# Patient Record
Sex: Male | Born: 2000 | Race: White | Hispanic: No | Smoking: Never smoker
Health system: Southern US, Community
[De-identification: ages and names within clinical notes are randomized; demographics above are authoritative.]

## PROBLEM LIST (undated history)

## (undated) DIAGNOSIS — E119 Type 2 diabetes mellitus without complications: Secondary | ICD-10-CM

---
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
## 2022-09-29 ENCOUNTER — Other Ambulatory Visit: Payer: Self-pay

## 2022-09-29 ENCOUNTER — Encounter: Payer: Self-pay | Admitting: Emergency Medicine

## 2022-09-29 ENCOUNTER — Emergency Department
Admission: EM | Admit: 2022-09-29 | Discharge: 2022-09-29 | Disposition: A | Discharge status: Home / Self Care | Payer: Medicaid Other | Attending: Emergency Medicine | Admitting: Emergency Medicine

## 2022-09-29 DIAGNOSIS — J069 Acute upper respiratory infection, unspecified: Secondary | ICD-10-CM | POA: Diagnosis not present

## 2022-09-29 DIAGNOSIS — H60502 Unspecified acute noninfective otitis externa, left ear: Secondary | ICD-10-CM

## 2022-09-29 DIAGNOSIS — H6092 Unspecified otitis externa, left ear: Secondary | ICD-10-CM | POA: Diagnosis not present

## 2022-09-29 DIAGNOSIS — R059 Cough, unspecified: Secondary | ICD-10-CM | POA: Diagnosis present

## 2022-09-29 DIAGNOSIS — Z1152 Encounter for screening for COVID-19: Secondary | ICD-10-CM | POA: Diagnosis not present

## 2022-09-29 LAB — RESP PANEL BY RT-PCR (RSV, FLU A&B, COVID)  RVPGX2
Influenza A by PCR: NEGATIVE
Influenza B by PCR: NEGATIVE
Resp Syncytial Virus by PCR: NEGATIVE
SARS Coronavirus 2 by RT PCR: NEGATIVE

## 2022-09-29 MED ORDER — OFLOXACIN 0.3 % OT SOLN: 5.0000 [drp] | Freq: Every day | OTIC | 0 refills | Status: AC

## 2022-09-29 NOTE — ED Triage Notes (Signed)
Pt via POV from home. Pt c/o cough, headache, and nasal congestion. Also, c/o R sided ear pain and "blockage" for the past 2 weeks. Denies any sick contacts. Pt is A&Ox4 and NAD

## 2022-09-29 NOTE — ED Provider Notes (Signed)
[facility] Provider Note    Event Date/Time   First MD Initiated Contact with Patient 09/29/22 1745     (approximate)   History   Cough and Nasal Congestion   HPI Jacob Burton is a 22 y.o. male presenting today for nasal congestion and cough.  Patient reports he has had the symptoms for 3 to 4 days.  He is also had occasional headache associated with this.  Symptoms do get better with Tylenol.  Incidentally noted a carbon oxide leak at his work that was 2 days ago.  He did not have change in the symptoms while this was going on or afterwards.  That has since been fixed and he has not been exposed to it again.  Also notes some ear pain and blockage has been present for the past 2 weeks.  No sore throat symptoms, shortness of breath, chest pain, nausea, vomiting.     Physical Exam   Triage Vital Signs: ED Triage Vitals  Encounter Vitals Group     BP 09/29/22 1607 134/85     Systolic BP Percentile --      Diastolic BP Percentile --      Pulse Rate 09/29/22 1607 95     Resp 09/29/22 1607 18     Temp 09/29/22 1607 98.4 F (36.9 C)     Temp Source 09/29/22 1607 Oral     SpO2 09/29/22 1607 98 %     Weight 09/29/22 1605 135 lb (61.2 kg)     Height 09/29/22 1605 5\' 8"  (1.727 m)     Head Circumference --      Peak Flow --      Pain Score 09/29/22 1605 0     Pain Loc --      Pain Education --      Exclude from Growth Chart --     Most recent vital signs: Vitals:   09/29/22 1607  BP: 134/85  Pulse: 95  Resp: 18  Temp: 98.4 F (36.9 C)  SpO2: 98%   Physical Exam: I have reviewed the vital signs and nursing notes. General: Awake, alert, no acute distress.  Nontoxic appearing. Head:  Atraumatic, normocephalic.   ENT:  EOM intact, PERRL. Oral mucosa is pink and moist with no lesions.  Mild posterior oropharynx erythema but no tonsillar enlargement or exudates.  No tender cervical lymphadenopathy.  Bilateral ears with significant earwax  present. Neck: Neck is supple with full range of motion, No meningeal signs. Cardiovascular:  RRR, No murmurs. Peripheral pulses palpable and equal bilaterally. Respiratory:  Symmetrical chest wall expansion.  No rhonchi, rales, or wheezes.  Good air movement throughout.  No use of accessory muscles.   Musculoskeletal:  No cyanosis or edema. Moving extremities with full ROM Abdomen:  Soft, nontender, nondistended. Neuro:  GCS 15, moving all four extremities, interacting appropriately. Speech clear. Psych:  Calm, appropriate.   Skin:  Warm, dry, no rash.     ED Results / Procedures / Treatments   Labs (all labs ordered are listed, but only abnormal results are displayed) Labs Reviewed  RESP PANEL BY RT-PCR (RSV, FLU A&B, COVID)  [id number]     EKG    RADIOLOGY    PROCEDURES:  Critical Care performed: No  Procedures   MEDICATIONS ORDERED IN ED: Medications - No data to display   IMPRESSION / MDM / ASSESSMENT AND PLAN / ED COURSE  I reviewed the triage vital signs and the nursing notes.  Differential diagnosis includes, but is not limited to, viral URI, otitis media, viral pharyngitis, less likely carbon monoxide toxicity.  Patient's presentation is most consistent with acute illness / injury with system symptoms.  Patient is a 22 year old male presenting today with viral upper respiratory symptoms.  Vital signs are stable and physical exam largely reassuring.  Patient was negative for COVID, flu, and RSV but still suspect another viral respiratory infection as a result of his symptoms today.  He is 2 days out from his brief carbon oxide exposure and I suspect this is not causing any symptoms of today's presentation.  Both ears were cleared out by nurse staff here today.  Excoriations and some discharge present on the left ear.  Will send patient out on otic drops over concern for possible otitis externa.  Patient given the number for ENT to help  with his chronic ear problems.  He was given strict return precautions.      FINAL CLINICAL IMPRESSION(S) / ED DIAGNOSES   Final diagnoses:  Viral URI with cough  Acute otitis externa of left ear, unspecified type     Rx / DC Orders   ED Discharge Orders          Ordered    ofloxacin (FLOXIN) 0.3 % OTIC solution  Daily        09/29/22 1913             Note:  This document was prepared using Dragon voice recognition software and may include unintentional dictation errors.   Janith Lima, MD 09/29/22 [phone]

## 2022-09-29 NOTE — Discharge Instructions (Signed)
You were seen in the emergency department today for your upper respiratory symptoms as well as ear pain.  I do think it is most likely that this is a viral upper respiratory infection.  I will prescribe you eardrops for your left ear to take to prevent any infection given the excoriations and drainage from that site.  Continue trying to wash the ear out at home to get more earwax out.  ENT information should be on your discharge paperwork to call to set up outpatient appointment for this chronic problem.

## 2022-09-29 NOTE — ED Notes (Signed)
This RN and Sydni, RN to bedside to irrigate pt's ears. Ears irrigated with sterile saline and hydrogen peroxide. Large amount of cerumen irrigated from pt's right ear, moderate amount of cerumen irrigated from left ear. Small amt. Remains in right ear, eardrum partially visualized with otoscope. Cerumen plug remains in left ear, this RN unable to visualize eardrum. Dr. Anner Crete notified.

## 2023-06-11 IMAGING — CR DG CHEST 2V
2 series · 2 of 2 positions shown · non-contrast
Comparison: Chest x-ray 03/07/2006.

CLINICAL DATA: 20-year-old male with history of cough and shortness
of breath.

EXAM:
CHEST - 2 VIEW

[chest pa]
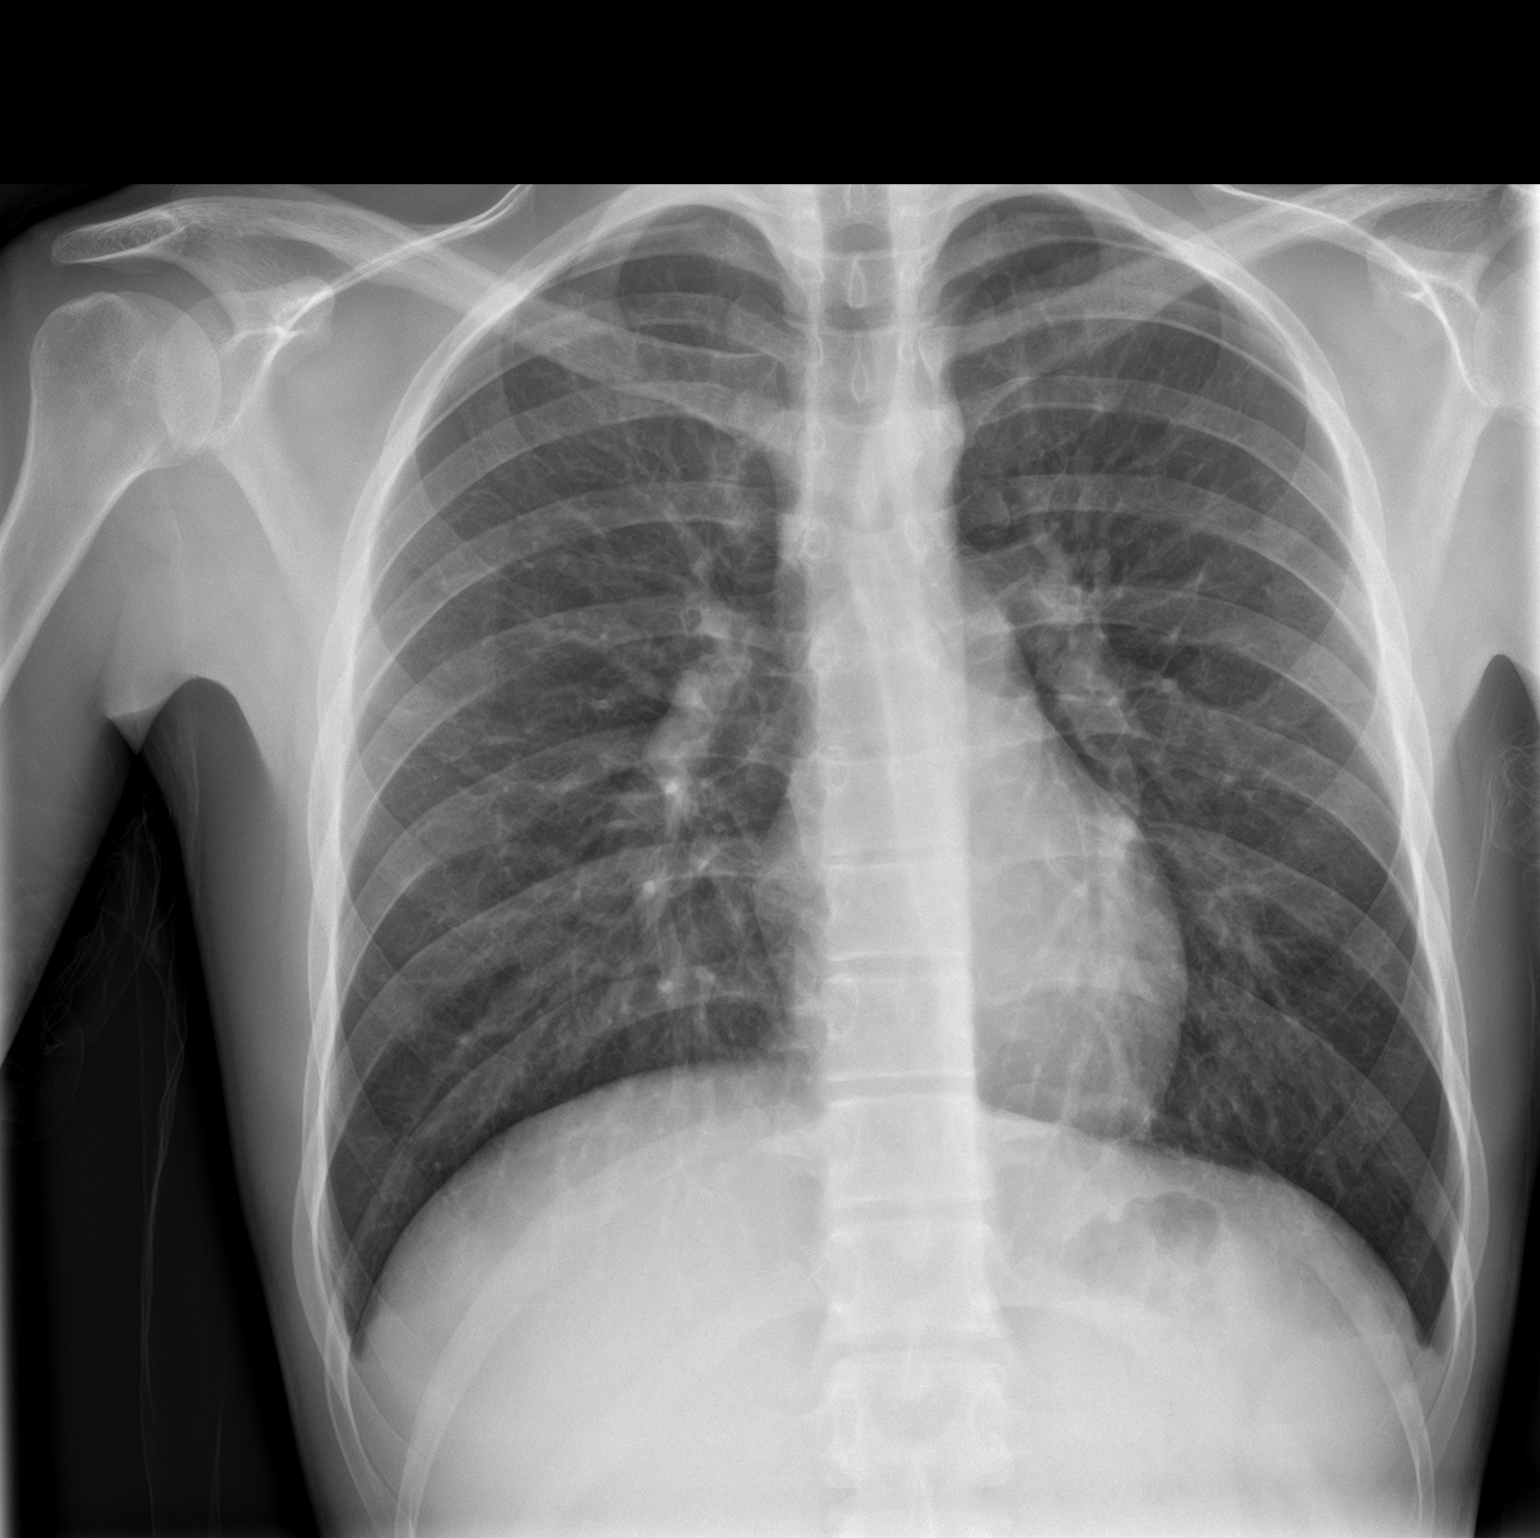

[chest lat]
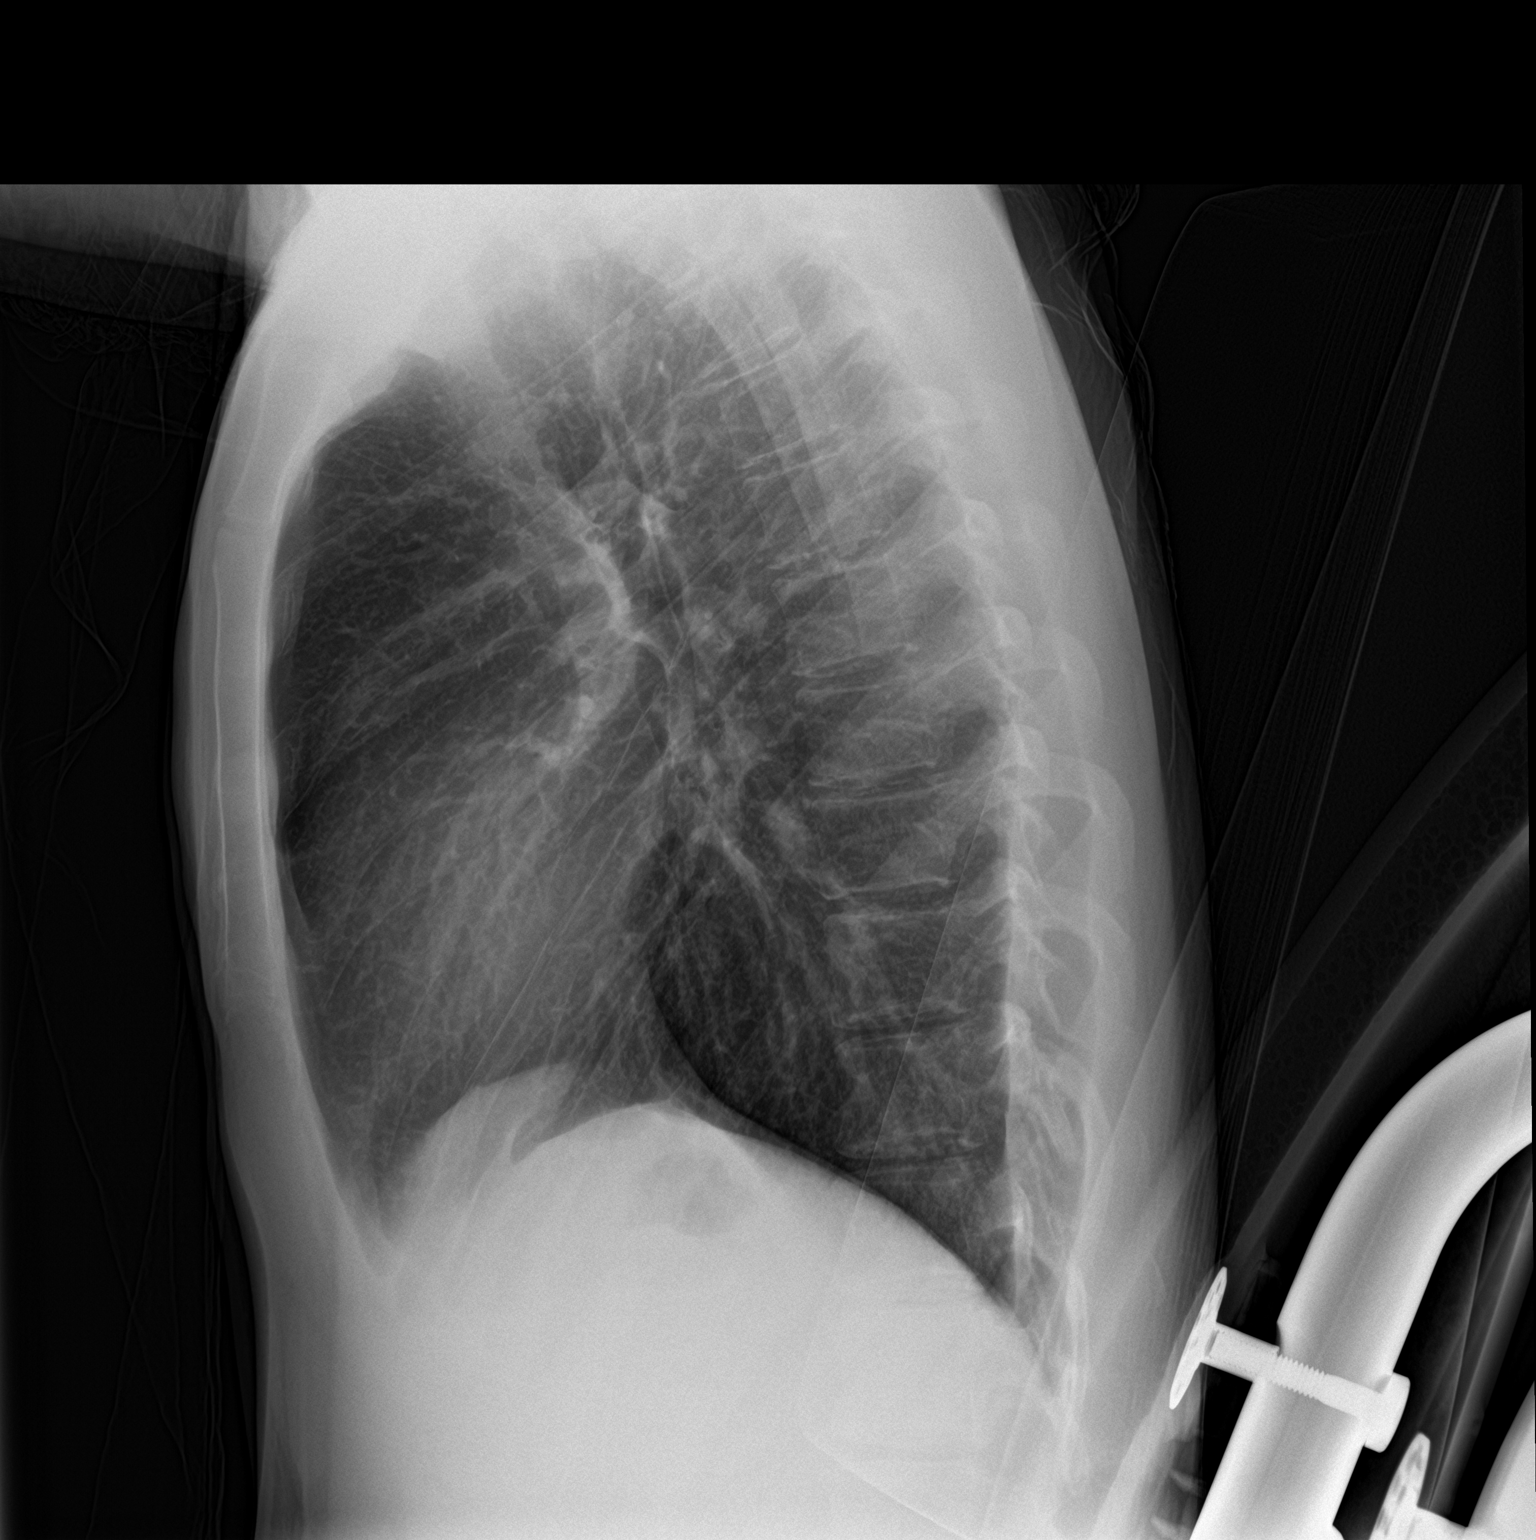

[2 of 2 positions shown; findings below may reference images not displayed]

FINDINGS: Lung volumes are normal. No consolidative airspace disease. No
pleural effusions. No pneumothorax. No pulmonary nodule or mass
noted. Pulmonary vasculature and the cardiomediastinal silhouette
are within normal limits.
IMPRESSION: No radiographic evidence of acute cardiopulmonary disease.

## 2023-10-15 NOTE — Discharge Summary (Signed)
 [facility] Discharge Summary  Admit Date: 10/15/2023 Discharge Date: 10/16/2023  Admitting Physician: Mercer Cline, MD Discharge Physician: Cline Mercer, MD  Primary Care Provider: Lennie Grumbles, Phone [phone]  Discharge Destination: Home  Admission Diagnoses:  DKA, type 1, not at goal (CMS/HHS-HCC) [E10.10]  Discharge Diagnoses:  Principal Problem (Resolved):   Type 1 diabetes mellitus with ketoacidosis without coma (CMS/HHS-HCC) Active Problems:   Type 1 diabetes mellitus without complication (CMS/HHS-HCC)  Primary Diagnosis: Admitted for DKA iso running out of insulin  pump meds iso losing insurance coverage  Changes Made (with rationale):  STARTED NPH 14u BID  STARTED Novolog  TID AC carb correction 1:8   CM sponsored one month of insulin  at discharge  To-Do List (incidental findings, follow-up studies, etc.): See below   Anticipatory Guidance for Outpatient Care:  Pt working on reapplying for [employer], also encouraged to obtain health insurance through his new employer. Opted for NPH for more affordable out-of-pocket cost.  Given lack of insurance unable to be seen at [facility] endocrine clinic. Not a [facility] resident so cannot be seen at [place]. Ambulatory referral to PCP placed at discharge, will attempt to set up with [facility] follow-up after Labor Day weekend and CM also provided list of free/discounted clinics in [place]. Sent an additional future Rx x1 month to local pharmacy starting 10/1 to pick up after finishing sponsored meds. Endocrine team provided clinic contact info in AVS in case of emergencies.     Results Pending at Discharge:  None Please see phone numbers at end of this summary for lab contact information.   Follow-up/Care Transition Plan: Sched. appts: No future appointments.  Follow-up info: No follow-up provider specified.     Allergies/Intolerances:  Allergies  Allergen Reactions  . Azithromycin  Hives and Other (See Comments)    hives Neuro problems  . Penicillins Dizziness and Hives    faints Has patient had a PCN reaction causing immediate rash, facial/tongue/throat swelling, SOB or lightheadedness with hypotension: YES Has patient had a PCN reaction causing severe rash involving mucus membranes or skin necrosis: NO Has patient had a PCN reaction that required hospitalization: WAS ALREADY IN HOSPITAL WHEN FOUND OUT. Has patient had a PCN reaction occurring within the last 10 years: NO If all of the above answers are NO, then may proceed with Cephalosporin use.  . Coconut Unknown  . Peach Swelling     New Adverse Drug Events: none  Medications:     Current Discharge Medication List     PAUSE taking these medications      Instructions  OMNIPOD DASH PODS (GEN 4) Crtg Wait to take this until your doctor or other care provider tells you to start again. Quantity: 15 each Refills: 11 Generic drug: insulin  pump cart,cont inf,BT  Inject 1 each subcutaneously as directed Change pod every 48 hours.       START taking these medications      Instructions  insulin  ASPART pen injector (concentration 100 units/mL) Quantity: 18 mL Refills: 0  Commonly known as: NovoLOG  FLEXPEN Inject 0-20 Units subcutaneously 3 (three) times daily with meals for 30 days Use a carb count ratio of 1 u of insulin  for every 8 grams of carbs. Doctor's comments: May switch between lispro and aspart and lispro-aabc (or between HumaLOG and Admelog and Lyumjev) per insurance preference.   insulin  ASPART pen injector (concentration 100 units/mL) Quantity: 18 mL Refills: 0 Start taking on: November 16, 2023  Commonly known as: NovoLOG  FLEXPEN Inject 0-20 Units subcutaneously  3 (three) times daily with meals for 30 days Use a carb count ratio of 1u of insulin  for every 8 grams of carbs.   insulin  NPH injection (concentration 100 units/mL) Refills: 0  Commonly known as: HumuLIN N Inject 14 Units  subcutaneously 2 (two) times daily before meals for 30 days Last time this was given: Ask your nurse or doctor   insulin  NPH pen injector (concentration 100 units/mL) Quantity: 8.4 mL Refills: 0 Start taking on: November 16, 2023  Commonly known as: HumuLIN N Pen Inject 14 Units subcutaneously 2 (two) times daily before meals for 30 days Last time this was given: Ask your nurse or doctor       CONTINUE taking these medications      Instructions  acetaminophen  325 MG tablet Refills: 0  Commonly known as: TYLENOL  Take 650 mg by mouth every 4 (four) hours as needed for Pain   DEXCOM G6 RECEIVER Misc Quantity: 1 each Refills: 1  Use 1 each once for 1 dose   DEXCOM G6 SENSOR Devi Quantity: 3 each Refills: 11  Use 1 each every 10 (ten) days   DEXCOM G7 SENSOR Devi Quantity: 3 each Refills: 11  Use 1 each every 10 (ten) days   FREESTYLE LIBRE 2 READER Misc Quantity: 1 each Refills: 1  Use 1 Device as directed   FREESTYLE LIBRE 2 SENSOR Kit Quantity: 2 kit Refills: 12  Change every 14 days   pen needle, diabetic 31 gauge x 3/16 needle Quantity: 100 each Refills: 0  Use as directed Doctor's comments: Needle length per patient preference or history.       STOP taking these medications    HumaLOG U-100 Insulin  injection (concentration 100 units/mL) Generic drug: insulin  LISPRO   insulin  GLARGINE pen injector (concentration 100 units/mL) Commonly known as: LANTUS  SOLOSTAR         Brief History of Present Illness: 23 y.o. male with T1DM presents with DKA   Patient has had T1DM and insulin  pump since age 66. Got kicked of medicaid for unclear reasons this month. He has been trying to reapply and appeal but it has not gone through yet.   Ran out of insulin  pump 2 days ago.    He was giving himself SQ humalog that he happened to have but blood sugars have been creeping up. Over the last 24 hours persistently over 400. He checks finger stick BG 5-7 times per day  (does not like using CGM).    Yesterday, he started having nausea, vomiting and headaches. He has had minimal PO intake and has had difficulty keeping food and fluids down. Also with polyuria and polydipsia. He knew he was in DKA so he presented to the ED.    Here, VSS. Labs notable for BG 575, CO2 20, BHB 3.52, pH 7.33. UA 4+ glucose, 2+ ketones. He was given 1L IVF and started on insulin  gtt.   Currently, he is not feeling much better yet because drip just started.    He has been otherwise well and has no acute complaints.    _____________________   Hospital Course by Problem:  #T1DM, poorly controlled (A1c 11.9% 09/2023) #DKA, resolving  Patient presenting with early DKA after 2 days without insulin  pump despite some SQ short-acting insulin . He lost insurance coverage a month ago and has been unable to get medications filled and is working on getting Medicaid coverage again. Last seen by pediatric endocrinology earlier this year 03/2023 with A1c 11.6% and updated A1c this  admission 11.9% (11-13% since 2021). Has not yet transitioned to adult endocrinology. Clinically improving with management for DKA with insulin  gtt and transitioned to insulin  successfully tolerating diet and resolved symptoms and DKA labs at time of discharge. Endocrinology consulted. Transitioned to subcutaneous insulin  instead of pump. Elected for NPH due to more affordable out of pocket cost. Confirmed with pt that NPH pens and Novolog  pens would be affordable out of pocket in the future. Discharge regimen was NPH 14 u BID and Aspart TID AC ICR 1:8. CM sponsored one month of medication at discharge (sponsored a month of Aspart pens; pharmacy did not have NPH pens in stock but able to provide NPH vial at bedside at discharge). Afterwards, written for a one month Rx of insulin  to his local [facility] starting 10/1. Discussed with pt following that he will need to have additional insulin  prescribed by a local provider (CM provided  free/discounted clinics in [place]) given that he is uninsured so unable to be seen at [facility]ocrine clinic or DOC, and does not live in [place] so cannot be seen in [place]. Pt also provided information on Medicaid office to follow-up his application and was encouraged to reach out to his new employer to obtain health insurance through them.      Surgeries and Procedures Performed:  None  _____________________  Discharge Exam:  BP 112/55 (BP Location: Left upper arm, Patient Position: Sitting)   Pulse 93   Temp 36.6 C (97.9 F) (Oral)   Resp 18   Ht 172.7 cm (5' 8)   Wt 70 kg (154 lb 5.2 oz)   SpO2 98%   BMI 23.46 kg/m     General: alert, cooperative, in NAD Eyes: conjunctiva clear, anicteric sclera HENT: oropharynx clear, moist mucous membranes Neck: no adenopathy, supple, symmetrical, trachea midline CV: regular rate and rhythm, without murmurs, rubs or gallops Resp: clear to auscultation, good air exchange Abd: soft, nontender, nondistended, normoactive bowel sounds  Rectal: deferred Ext: no lower extremity edema Skin: no rashes or lesions Psych: oriented to time, place and person, mood and affect are appropriate Neuro: Grossly normal and symmetric strength in upper and lower extremities  Pertinent Lab Testing: Recent Labs  Lab 10/15/23 1216 10/15/23 2039 10/16/23 0512  NA 136 141 141  K 3.9 4.0 3.6  CL 102 109* 106  CO2 21 24 23   BUN 18 15 17   CREATININE 0.7 0.7 0.6  GLUCOSE 244* 175* 207*  CALCIUM 8.6* 8.8 9.1   No results for input(s): AST, ALT, ALKPHOS, TBILI in the last 168 hours.  Recent Labs  Lab 10/15/23 0556 10/16/23 0512  WBC 6.1 5.6  HGB 14.7 13.9  HCT 41.4 39.7  PLT 267 244   No results for input(s): APTT, INR in the last 168 hours.   Pertinent Imaging:  None _____________________  Code Status: Full Code Goals of care were not addressed during this admission.   Status on Discharge:  Current activity: Walks  occasionally (10/16/23 1000) Current mobility: No limitation (10/16/23 1000)  Activity Recommendation: activity as tolerated  Other Discharge Instructions: Services setup at discharge: None Tubes/lines at discharge: None  Diet: Diet regular  Wound Care Order Instructions     None       _____________________  Time spent on discharge process: 45 minutes    MERCER CLINE, MD [facility]  10/16/2023   Hospital Contact Information:  [employer] [facility]) [facility]) [facility])  Pending tests:  Laboratory: [phone] Microbiology: (  [id number]) [id number] Pathology: [phone] Radiology: [phone]  General questions: [phone] Pending tests: Laboratory: [phone] Microbiology: [phone] Pathology: [phone] Radiology: [phone]  General questions:  [phone] Pending tests:  Laboratory: [phone] Microbiology: [phone] Pathology: [phone] Radiology: [phone]  General questions:  [phone]

## 2023-11-04 ENCOUNTER — Emergency Department
Admission: EM | Admit: 2023-11-04 | Discharge: 2023-11-06 | Disposition: A | Discharge status: Other Acute Inpatient Hospital | Payer: Self-pay | Attending: Emergency Medicine | Admitting: Emergency Medicine

## 2023-11-04 DIAGNOSIS — T383X2A Poisoning by insulin and oral hypoglycemic [antidiabetic] drugs, intentional self-harm, initial encounter: Secondary | ICD-10-CM | POA: Insufficient documentation

## 2023-11-04 DIAGNOSIS — E109 Type 1 diabetes mellitus without complications: Secondary | ICD-10-CM | POA: Diagnosis not present

## 2023-11-04 DIAGNOSIS — T1491XA Suicide attempt, initial encounter: Secondary | ICD-10-CM | POA: Insufficient documentation

## 2023-11-04 DIAGNOSIS — Z8639 Personal history of other endocrine, nutritional and metabolic disease: Secondary | ICD-10-CM

## 2023-11-04 LAB — CBG MONITORING, ED
Glucose-Capillary: 99 mg/dL (ref 70–99)
Glucose-Capillary: 45 mg/dL — ABNORMAL LOW (ref 70–99)
Glucose-Capillary: 79 mg/dL (ref 70–99)
Glucose-Capillary: 66 mg/dL — ABNORMAL LOW (ref 70–99)
Glucose-Capillary: 91 mg/dL (ref 70–99)
Glucose-Capillary: 142 mg/dL — ABNORMAL HIGH (ref 70–99)
Glucose-Capillary: 146 mg/dL — ABNORMAL HIGH (ref 70–99)
Glucose-Capillary: 178 mg/dL — ABNORMAL HIGH (ref 70–99)
Glucose-Capillary: 243 mg/dL — ABNORMAL HIGH (ref 70–99)
Glucose-Capillary: 365 mg/dL — ABNORMAL HIGH (ref 70–99)
Glucose-Capillary: 194 mg/dL — ABNORMAL HIGH (ref 70–99)
Glucose-Capillary: 200 mg/dL — ABNORMAL HIGH (ref 70–99)
Glucose-Capillary: 294 mg/dL — ABNORMAL HIGH (ref 70–99)
Glucose-Capillary: 411 mg/dL — ABNORMAL HIGH (ref 70–99)

## 2023-11-04 LAB — COMPREHENSIVE METABOLIC PANEL WITH GFR
ALT: 269 U/L — ABNORMAL HIGH (ref 0–44)
AST: 498 U/L — ABNORMAL HIGH (ref 15–41)
Albumin: 3.3 g/dL — ABNORMAL LOW (ref 3.5–5.0)
Alkaline Phosphatase: 65 U/L (ref 38–126)
Anion gap: 13 (ref 5–15)
BUN: 19 mg/dL (ref 6–20)
CO2: 23 mmol/L (ref 22–32)
Calcium: 8.8 mg/dL — ABNORMAL LOW (ref 8.9–10.3)
Chloride: 104 mmol/L (ref 98–111)
Creatinine, Ser: 0.57 mg/dL — ABNORMAL LOW (ref 0.61–1.24)
GFR, Estimated: >60 mL/min (ref >60)
Glucose, Bld: 69 mg/dL — ABNORMAL LOW (ref 70–99)
Potassium: 2.9 mmol/L — ABNORMAL LOW (ref 3.5–5.1)
Sodium: 140 mmol/L (ref 135–145)
Total Bilirubin: 0.5 mg/dL (ref 0.0–1.2)
Total Protein: 6.3 g/dL — ABNORMAL LOW (ref 6.5–8.1)

## 2023-11-04 LAB — CBC WITH DIFFERENTIAL/PLATELET
Abs Immature Granulocytes: 0.02 K/uL (ref 0.00–0.07)
Basophils Absolute: 0 K/uL (ref 0.0–0.1)
Basophils Relative: 0 %
Eosinophils Absolute: 0 K/uL (ref 0.0–0.5)
Eosinophils Relative: 0 %
HCT: 38.1 % — ABNORMAL LOW (ref 39.0–52.0)
Hemoglobin: 13.9 g/dL (ref 13.0–17.0)
Immature Granulocytes: 0 %
Lymphocytes Relative: 18 %
Lymphs Abs: 1 K/uL (ref 0.7–4.0)
MCH: 30.3 pg (ref 26.0–34.0)
MCHC: 36.5 g/dL — ABNORMAL HIGH (ref 30.0–36.0)
MCV: 83.2 fL (ref 80.0–100.0)
Monocytes Absolute: 0.4 K/uL (ref 0.1–1.0)
Monocytes Relative: 7 %
Neutro Abs: 3.9 K/uL (ref 1.7–7.7)
Neutrophils Relative %: 75 %
Platelets: 229 K/uL (ref 150–400)
RBC: 4.58 MIL/uL (ref 4.22–5.81)
RDW: 12.5 % (ref 11.5–15.5)
WBC: 5.3 K/uL (ref 4.0–10.5)
nRBC: 0 % (ref 0.0–0.2)

## 2023-11-04 LAB — URINE DRUG SCREEN, QUALITATIVE (ARMC ONLY)
Amphetamines, Ur Screen: NOT DETECTED
Barbiturates, Ur Screen: NOT DETECTED
Benzodiazepine, Ur Scrn: NOT DETECTED
Cannabinoid 50 Ng, Ur ~~LOC~~: NOT DETECTED
Cocaine Metabolite,Ur ~~LOC~~: NOT DETECTED
MDMA (Ecstasy)Ur Screen: NOT DETECTED
Methadone Scn, Ur: NOT DETECTED
Opiate, Ur Screen: NOT DETECTED
Phencyclidine (PCP) Ur S: NOT DETECTED
Tricyclic, Ur Screen: NOT DETECTED

## 2023-11-04 LAB — TROPONIN I (HIGH SENSITIVITY)
Troponin I (High Sensitivity): 3 ng/L (ref <18)
Troponin I (High Sensitivity): 2 ng/L (ref <18)

## 2023-11-04 LAB — HEMOGLOBIN A1C
Hgb A1c MFr Bld: 10.3 % — ABNORMAL HIGH (ref 4.8–5.6)
Mean Plasma Glucose: 248.91 mg/dL

## 2023-11-04 LAB — POTASSIUM: Potassium: 4.8 mmol/L (ref 3.5–5.1)

## 2023-11-04 LAB — SALICYLATE LEVEL: Salicylate Lvl: <7 mg/dL — ABNORMAL LOW (ref 7.0–30.0)

## 2023-11-04 LAB — GLUCOSE, RANDOM: Glucose, Bld: 466 mg/dL — ABNORMAL HIGH (ref 70–99)

## 2023-11-04 LAB — ACETAMINOPHEN LEVEL: Acetaminophen (Tylenol), Serum: <10 ug/mL — ABNORMAL LOW (ref 10–30)

## 2023-11-04 LAB — ETHANOL: Alcohol, Ethyl (B): <15 mg/dL (ref <15)

## 2023-11-04 MED ORDER — INSULIN ASPART 100 UNIT/ML IJ SOLN
7.0000 [IU] | Freq: Once | INTRAMUSCULAR | Status: AC
  Administered 2023-11-04: 7 [IU] via SUBCUTANEOUS
  Filled 2023-11-04: qty 1

## 2023-11-04 MED ORDER — INSULIN GLARGINE 100 UNIT/ML ~~LOC~~ SOLN
25.0000 [IU] | SUBCUTANEOUS | Status: DC
  Administered 2023-11-04 – 2023-11-05 (×2): 25 [IU] via SUBCUTANEOUS
  Filled 2023-11-04 (×2): qty 0.25

## 2023-11-04 MED ORDER — INSULIN ASPART 100 UNIT/ML IJ SOLN
0.0000 [IU] | Freq: Three times a day (TID) | INTRAMUSCULAR | Status: DC
  Administered 2023-11-04: 9 [IU] via SUBCUTANEOUS
  Administered 2023-11-04: 2 [IU] via SUBCUTANEOUS
  Filled 2023-11-04: qty 1
  Filled 2023-11-04: qty 9

## 2023-11-04 MED ORDER — INSULIN ASPART 100 UNIT/ML IJ SOLN: 0.0000 [IU] | Freq: Every day | INTRAMUSCULAR | Status: DC

## 2023-11-04 MED ORDER — INSULIN ASPART 100 UNIT/ML IJ SOLN
4.0000 [IU] | Freq: Three times a day (TID) | INTRAMUSCULAR | Status: DC
  Administered 2023-11-04: 4 [IU] via SUBCUTANEOUS
  Filled 2023-11-04: qty 1

## 2023-11-04 MED ORDER — POTASSIUM CHLORIDE CRYS ER 20 MEQ PO TBCR
60.0000 meq | EXTENDED_RELEASE_TABLET | Freq: Once | ORAL | Status: AC
  Administered 2023-11-04: 60 meq via ORAL
  Filled 2023-11-04: qty 3

## 2023-11-04 NOTE — ED Notes (Signed)
 RECOMMENDATION IS TO CONTINUE IVC AND INPATIENT TREATMENT AFTER BEING CLEARED MEDICALLY

## 2023-11-04 NOTE — ED Notes (Signed)
 Patient changed out to paper scrubs. Belongings taken to nurses station including:  Red hoodie [employer] sneakers 2 grey socks Black Jeans Underwear Hat Headphones Cell phone Work ID

## 2023-11-04 NOTE — Progress Notes (Addendum)
 Full consult note to follow: Patient is 23 year old male presenting with worsening depressive symptoms, lack of motivation, anhedonia, and intentional overdose. Given the intentional overdose in the emergency setting,  and lack of outpatient follow-up, the patient remains at high risk for decompensation without inpatient stabilization. At this time, we will recommend continuing IVC and recommending inpatient psychiatric admission after medically stable/cleared.

## 2023-11-04 NOTE — ED Provider Notes (Signed)
 [facility] Provider Note    Event Date/Time   First MD Initiated Contact with Patient 11/04/23 [phone]     (approximate)   History   Drug Overdose (Insulin  (unknown amount))   HPI  Jacob Burton is a 23 y.o. male   Past medical history of type I diabetic who recently lost insurance coverage and transition from insulin  pump to self-administered insulin , here with intentional overdose on insulin .  He was on his way to work when he decided to overdose as a self-harm attempt.  He says he felt suicidal at the time.  He denies current suicidality.  He states that he took his short acting insulin  and has the bottle in hand.  He was not sure how much was leftover but that he took the remainder of the bottle.  He denies any other coingestions alcohol or drug use.  He denies any other form of self-harm.  He denies HI or AVH.  He does have a longer acting insulin  that he takes at in the morning and at nighttime and the last dose was his typical dose in the evening.  He did not overdose on the long-acting insulin .  Independent Historian contributed to assessment above: EMS gives report as above, was initially found to be blood glucose 70 and was given D10 with a recheck in the 110s.  External Medical Documents Reviewed: Recent discharge summary from August 2025 at outside hospital when he presented with DKA      Physical Exam   Triage Vital Signs: ED Triage Vitals [11/04/23 0603]  Encounter Vitals Group     BP      Girls Systolic BP Percentile      Girls Diastolic BP Percentile      Boys Systolic BP Percentile      Boys Diastolic BP Percentile      Pulse      Resp      Temp      Temp src      SpO2      Weight 140 lb (63.5 kg)     Height 5' 9 (1.753 m)     Head Circumference      Peak Flow      Pain Score      Pain Loc      Pain Education      Exclude from Growth Chart     Most recent vital signs: Vitals:   11/04/23 0559 11/04/23 0607  BP:   119/80  Pulse: (!) 110 (!) 109  Resp: (!) 23 13  SpO2: 100% 100%    General: Awake, no distress.  CV:  Good peripheral perfusion.  Resp:  Normal effort.  Abd:  No distention.  Other:  Awake alert answering my questions appropriately.  Pupils equal round and reactive, midrange.  No signs of trauma to the head or arms, soft benign abdominal exam, clear lungs.  Maintaining airway.  Breathing comfortably.   ED Results / Procedures / Treatments   Labs (all labs ordered are listed, but only abnormal results are displayed) Labs Reviewed  CBC WITH DIFFERENTIAL/PLATELET - Abnormal; Notable for the following components:      Result Value   HCT 38.1 (*)    MCHC 36.5 (*)    All other components within normal limits  COMPREHENSIVE METABOLIC PANEL WITH GFR  ETHANOL  ACETAMINOPHEN  LEVEL  SALICYLATE LEVEL  URINE DRUG SCREEN, QUALITATIVE ([facility] ONLY)  CBG MONITORING, ED  CBG MONITORING, ED  CBG MONITORING, ED  CBG MONITORING, ED  CBG MONITORING, ED  CBG MONITORING, ED  CBG MONITORING, ED  CBG MONITORING, ED  CBG MONITORING, ED  CBG MONITORING, ED  CBG MONITORING, ED  CBG MONITORING, ED  CBG MONITORING, ED  CBG MONITORING, ED  CBG MONITORING, ED  CBG MONITORING, ED  CBG MONITORING, ED  CBG MONITORING, ED  TROPONIN I (HIGH SENSITIVITY)     I ordered and reviewed the above labs they are notable for capillary blood glucose initially 99, cell counts unremarkable  EKG  ED ECG REPORT I, Ginnie Shams, the attending physician, personally viewed and interpreted this ECG.   Date: 11/04/2023  EKG Time: 0600  Rate: sinus tachycardia  Rhythm: nl  Axis: nl  Intervals:no stemi  ST&T Change: nl  PROCEDURES:  Critical Care performed: Yes, see critical care procedure note(s)  .Critical Care  Performed by: Shams Ginnie, MD Authorized by: Shams Ginnie, MD   Critical care provider statement:    Critical care time (minutes):  30   Critical care was time spent personally by me on the  following activities:  Development of treatment plan with patient or surrogate, discussions with consultants, evaluation of patient's response to treatment, examination of patient, ordering and review of laboratory studies, ordering and review of radiographic studies, ordering and performing treatments and interventions, pulse oximetry, re-evaluation of patient's condition and review of old charts    MEDICATIONS ORDERED IN ED: Medications - No data to display  External physician / consultants:  I spoke with [place]  poison control regarding care plan for this patient.   IMPRESSION / MDM / ASSESSMENT AND PLAN / ED COURSE  I reviewed the triage vital signs and the nursing notes.                                Patient's presentation is most consistent with acute presentation with potential threat to life or bodily function.  Differential diagnosis includes, but is not limited to, intentional overdose of insulin , other coingestions, suicidal attempt, hypoglycemia or electrolyte derangements   The patient is on the cardiac monitor to evaluate for evidence of arrhythmia and/or significant heart rate changes.  MDM:    Patient with intentional overdose of insulin , but denies any other self-harm attempts or coingestions.  Awake alert with normal blood glucose now after D10 administered by EMS.  Will continue every hour CBGs.  Consulted with poison control who recommends monitoring for 6 hours from the time of ED arrival, and I will order blood test as well as toxicologic tests.  Placed under IVC given suicide attempt.  I have also placed a diabetes coordinator consultation for further insulin /CBG recommendations for after he is medically cleared as he was recently taken off of his insulin  pump regimen and switched to a new regimen due to insurance problems.            FINAL CLINICAL IMPRESSION(S) / ED DIAGNOSES   Final diagnoses:  Insulin  overdose, intentional self-harm, initial  encounter (HCC)  History of type 1 diabetes mellitus     Rx / DC Orders   ED Discharge Orders     None        Note:  This document was prepared using Dragon voice recognition software and may include unintentional dictation errors.    Shams Ginnie, MD 11/04/23 [phone]

## 2023-11-04 NOTE — ED Notes (Signed)
Pt was provided breakfast at bedside.

## 2023-11-04 NOTE — ED Notes (Signed)
Patient given a diet ginger ale
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
## 2023-11-04 NOTE — Inpatient Diabetes Management (Addendum)
 Inpatient Diabetes Program Recommendations  AACE/ADA: New Consensus Statement on Inpatient Glycemic Control (2015)  Target Ranges:  Prepandial:   less than 140 mg/dL      Peak postprandial:   less than 180 mg/dL (1-2 hours)      Critically ill patients:  140 - 180 mg/dL    Latest Reference Range & Units 11/04/23 05:57 11/04/23 06:53 11/04/23 07:22 11/04/23 07:57 11/04/23 08:32 11/04/23 09:35 11/04/23 10:39 11/04/23 11:44 11/04/23 12:42  Glucose-Capillary 70 - 99 mg/dL 99 45 (L) 79 66 (L) 91 142 (H) 146 (H) 178 (H) 243 (H)     Admit with: Intentional OD with Insulin  (Rapid-acting)  History: Type 1 diabetes  Home DM Meds: Lantus         Humalog       No Longer using Insulin  Pump  Current Orders: None   CBG now 243 at 12pm.  Recommend the following: Start Lantus  25 units daily (start now) Start Novolog  Sensitive Correction Scale/ SSI (0-9 units) TID AC + HS Start Novolog  4 units TID with meals HOLD if pt NPO HOLD if pt eats <50% meals   Addendum 2:45pm--Met w/ pt down in the BHU of the ED.  Pt confirmed with me that he hasn't used his insulin  pump for about 6 weeks.  Lost his Medicaid coverage and is currently working on trying to get Medicaid reinstated--unsure why Medicaid lapsed?  He will be able to get insurance through [employer] but insurance won't start until 90 days from day 1 of employment--has only been working for [employer] for 1 mos.  Confirmed with pt he was taking NPH Insulin  12 units BID + Novolog  1 unit for every 8 grams of carbs + correction factor but pt could no recall the exact correction ratio.  Explained to pt that we have started Lantus  and Novolog  SSI and Novolog  Meal Coverage and that we will assist the MDs to adjust his insulin  as needed.  Pt appreciative of visit.     Per MD notes: recently lost insurance coverage and transition from insulin  pump to self-administered insulin   Hospitalized at [facility] 8/30-8/31/2025 with DKA due to running out of insulin  pump  supplies Discharged with 1 month supply Insulin  Pens Provided Info to apply for Medicaid Novolog  1:8 grams Carbs NPH Insulin  14 units BID  When pt was using Insulin  pump, was getting the following amount of insulin : Basal per 24H period= 28.8 units Carb Ratio= 1:7 Correction Factor Ratio= 1:30 Target CBG 110-130    --Will follow patient during hospitalization--  Adina Rudolpho Arrow RN, MSN, CDCES Diabetes Coordinator Inpatient Glycemic Control Team Team Pager: [phone] (8a-5p)

## 2023-11-04 NOTE — ED Notes (Addendum)
 Contacted [place] poison control - discussed the case with Patty.  Observe the patient for 6 hours checking CBGs every hour  If the patient becomes hypoglycemic- treat with food rather than IV intervention.   EDP and primary RN made aware of the recommendations.

## 2023-11-04 NOTE — ED Notes (Signed)
 Sonny, RN from poison control called for update. After updating her on pt's condition and BS's since this morning, she said she will close the case and staff can reach out back to them in case of any questions.

## 2023-11-04 NOTE — ED Notes (Signed)
IVC pending consult   

## 2023-11-04 NOTE — ED Provider Notes (Addendum)
 Procedures     ----------------------------------------- 12:51 PM on 11/04/2023 ----------------------------------------- CBG stable for the past 3 to 4 hours.  Mental status normal.  Pt is medically clear.  Will follow-up insulin  regimen recommendations from diabetes coordinator, update discussed with psychiatry team who plans to admit.     Viviann Pastor, MD 11/04/23 1251    Viviann Pastor, MD 11/04/23 1343

## 2023-11-04 NOTE — ED Notes (Addendum)
 Rechecked CBG, result of 45. MD notified. Patient given apple juice and turkey sandwhich
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
## 2023-11-04 NOTE — ED Notes (Signed)
IVC  MOVED TO  [facility]  PENDING  CONSULT 

## 2023-11-04 NOTE — ED Triage Notes (Addendum)
 Patient presents to the ED via EMS for intentional insulin  overdose. Patient states he's a type 1 diabetic and injected unknown amount of Insulin  subcutaneously. Patient is tearful on assessment, AXO4, VSS, MD at bedside.

## 2023-11-04 NOTE — ED Notes (Signed)
 Patient given crackers, peanut butter, and apple juice due to low CBG.

## 2023-11-04 NOTE — ED Notes (Signed)

## 2023-11-05 LAB — CBG MONITORING, ED
Glucose-Capillary: 172 mg/dL — ABNORMAL HIGH (ref 70–99)
Glucose-Capillary: 179 mg/dL — ABNORMAL HIGH (ref 70–99)
Glucose-Capillary: 215 mg/dL — ABNORMAL HIGH (ref 70–99)
Glucose-Capillary: 122 mg/dL — ABNORMAL HIGH (ref 70–99)

## 2023-11-05 MED ORDER — INSULIN ASPART 100 UNIT/ML IJ SOLN
6.0000 [IU] | Freq: Three times a day (TID) | INTRAMUSCULAR | Status: DC
  Administered 2023-11-05 (×3): 6 [IU] via SUBCUTANEOUS
  Filled 2023-11-05 (×3): qty 1

## 2023-11-05 MED ORDER — INSULIN ASPART 100 UNIT/ML IJ SOLN
0.0000 [IU] | Freq: Three times a day (TID) | INTRAMUSCULAR | Status: DC
  Administered 2023-11-05: 3 [IU] via SUBCUTANEOUS
  Administered 2023-11-05: 5 [IU] via SUBCUTANEOUS
  Administered 2023-11-05: 3 [IU] via SUBCUTANEOUS
  Filled 2023-11-05 (×3): qty 1

## 2023-11-05 NOTE — ED Notes (Signed)
 ivc/consult completed/recommended for inpatient psychiatric hospitalization after medically cleared.

## 2023-11-05 NOTE — ED Provider Notes (Addendum)
 Emergency Medicine Observation Re-evaluation Note  Jacob Burton is a 23 y.o. male, seen on rounds today.  Pt initially presented to the ED for complaints of Drug Overdose (Insulin  (unknown amount)) Currently, the patient is resting  Physical Exam  BP 123/79 (BP Location: Left Arm)   Pulse (!) 110   Temp 98.4 F (36.9 C) (Oral)   Resp 20   Ht 5' 9 (1.753 m)   Wt 63.5 kg   SpO2 97%   BMI 20.67 kg/m  Physical Exam General: No acute distress Cardiac: Well-perfused Lungs: No respiratory distress   ED Course / MDM  EKG:EKG Interpretation Date/Time:  Friday November 04 2023 06:00:47 EDT Ventricular Rate:  107 PR Interval:  109 QRS Duration:  97 QT Interval:  341 QTC Calculation: 455 R Axis:   97  Text Interpretation: Sinus tachycardia Borderline right axis deviation Borderline T abnormalities, anterior leads ST elevation, consider lateral injury Confirmed by UNCONFIRMED, DOCTOR ([id number]), editor Alex Slough [phone]) on 11/04/2023 10:13:24 AM  I have reviewed the labs performed to date as well as medications administered while in observation.  Recent changes in the last 24 hours include patient placed on insulin  sliding scale, Lantus  and low-carb diet per diabetes educator.  However patient continues to still have elevated glucoses greater than 400.  Unfortunately diabetes educator not available at this time.  In response will increase the sliding scale from 0-9, to 0 to 15 units  Plan  Current plan is for TOC/psych    Nicholaus Rolland BRAVO, MD 11/05/23 0737   8:13 AM on 11/05/2023: Diabetes coordinator also recommends increasing patient's NovoLog  from 4 mg 3 times daily to 6 mg 3 times daily in addition to the increase in sliding scale   Nicholaus Rolland BRAVO, MD 11/05/23 [phone]

## 2023-11-05 NOTE — ED Notes (Signed)
 Patient pleasant and cooperative upon approach. 7 units of insulin  given to upper arm. Mood good. Resting in bed with eyes closed.

## 2023-11-05 NOTE — BH Assessment (Signed)
 Patient is to be admitted to [facility] BMU by Dr.  Aryendra Shrivastava.  Attending Physician will be Dr. Hellen.   Patient has been assigned to room 320, by [facility] Charge Nurse Oddis Birmingham.   Intake Paper Work has been signed and placed on patient chart.   ER staff is aware of the admission: Ronnie, ER Secretary   Dr. Floy, ER MD  Naomie, Patient's Nurse  Charmaine, Patient Access.

## 2023-11-05 NOTE — ED Notes (Signed)
 Dinner tray provided

## 2023-11-05 NOTE — Inpatient Diabetes Management (Signed)
 Inpatient Diabetes Program Recommendations  AACE/ADA: New Consensus Statement on Inpatient Glycemic Control (2015)  Target Ranges:  Prepandial:   less than 140 mg/dL      Peak postprandial:   less than 180 mg/dL (1-2 hours)      Critically ill patients:  140 - 180 mg/dL   Lab Results  Component Value Date   GLUCAP 411 (H) 11/04/2023   HGBA1C 10.3 (H) 11/04/2023    Review of Glycemic Control  Latest Reference Range & Units 11/04/23 18:08 11/04/23 20:03 11/04/23 21:31  Glucose-Capillary 70 - 99 mg/dL 799 (H) 705 (H) 588 (H)  (H): Data is abnormally high Admit with: Intentional OD with Insulin  (Rapid-acting)   History: Type 1 diabetes   Home DM Meds: Lantus                               Humalog                             No Longer using Insulin  Pump   Current Orders: Novolog  0-15 units TID, Novolog  0-5 units at bedtime, Novolog  4 units TID, Lantus  25 units QD     Recommend the following: Further increasing Novolog  6 units TID with meals HOLD if pt NPO HOLD if pt eats <50% meals   Thanks, Tinnie Minus, MSN, RNC-OB Diabetes Coordinator [phone] (8a-5p)

## 2023-11-05 NOTE — ED Notes (Signed)
Snack given to patient.  

## 2023-11-06 ENCOUNTER — Other Ambulatory Visit: Payer: Self-pay

## 2023-11-06 ENCOUNTER — Inpatient Hospital Stay
Admission: AD | Admit: 2023-11-06 | Discharge: 2023-11-15 | DRG: 885 | Disposition: A | Discharge status: Home / Self Care | Payer: 59 | Source: Intra-hospital | Attending: Psychiatry | Admitting: Psychiatry

## 2023-11-06 ENCOUNTER — Encounter: Payer: Self-pay | Admitting: Psychiatry

## 2023-11-06 DIAGNOSIS — F332 Major depressive disorder, recurrent severe without psychotic features: Principal | ICD-10-CM | POA: Diagnosis present

## 2023-11-06 DIAGNOSIS — Z91411 Personal history of adult psychological abuse: Secondary | ICD-10-CM

## 2023-11-06 DIAGNOSIS — F329 Major depressive disorder, single episode, unspecified: Secondary | ICD-10-CM | POA: Diagnosis present

## 2023-11-06 DIAGNOSIS — Z91410 Personal history of adult physical and sexual abuse: Secondary | ICD-10-CM

## 2023-11-06 DIAGNOSIS — Z794 Long term (current) use of insulin: Secondary | ICD-10-CM | POA: Diagnosis not present

## 2023-11-06 DIAGNOSIS — Z888 Allergy status to other drugs, medicaments and biological substances status: Secondary | ICD-10-CM | POA: Diagnosis not present

## 2023-11-06 DIAGNOSIS — Y92009 Unspecified place in unspecified non-institutional (private) residence as the place of occurrence of the external cause: Secondary | ICD-10-CM | POA: Diagnosis not present

## 2023-11-06 DIAGNOSIS — Z638 Other specified problems related to primary support group: Secondary | ICD-10-CM

## 2023-11-06 DIAGNOSIS — Z880 Allergy status to penicillin: Secondary | ICD-10-CM | POA: Diagnosis not present

## 2023-11-06 DIAGNOSIS — T383X2A Poisoning by insulin and oral hypoglycemic [antidiabetic] drugs, intentional self-harm, initial encounter: Principal | ICD-10-CM | POA: Diagnosis present

## 2023-11-06 DIAGNOSIS — Z79899 Other long term (current) drug therapy: Secondary | ICD-10-CM | POA: Diagnosis not present

## 2023-11-06 DIAGNOSIS — E109 Type 1 diabetes mellitus without complications: Secondary | ICD-10-CM | POA: Diagnosis present

## 2023-11-06 DIAGNOSIS — Z9151 Personal history of suicidal behavior: Secondary | ICD-10-CM | POA: Diagnosis not present

## 2023-11-06 DIAGNOSIS — Z9152 Personal history of nonsuicidal self-harm: Secondary | ICD-10-CM | POA: Diagnosis not present

## 2023-11-06 LAB — COMPREHENSIVE METABOLIC PANEL WITH GFR
ALT: 526 U/L — ABNORMAL HIGH (ref 0–44)
AST: 371 U/L — ABNORMAL HIGH (ref 15–41)
Albumin: 3.5 g/dL (ref 3.5–5.0)
Alkaline Phosphatase: 83 U/L (ref 38–126)
Anion gap: 11 (ref 5–15)
BUN: 18 mg/dL (ref 6–20)
CO2: 27 mmol/L (ref 22–32)
Calcium: 9.1 mg/dL (ref 8.9–10.3)
Chloride: 97 mmol/L — ABNORMAL LOW (ref 98–111)
Creatinine, Ser: 0.81 mg/dL (ref 0.61–1.24)
GFR, Estimated: >60 mL/min (ref >60)
Glucose, Bld: 215 mg/dL — ABNORMAL HIGH (ref 70–99)
Potassium: 4.5 mmol/L (ref 3.5–5.1)
Sodium: 135 mmol/L (ref 135–145)
Total Bilirubin: 0.9 mg/dL (ref 0.0–1.2)
Total Protein: 6.6 g/dL (ref 6.5–8.1)

## 2023-11-06 LAB — GLUCOSE, CAPILLARY
Glucose-Capillary: 142 mg/dL — ABNORMAL HIGH (ref 70–99)
Glucose-Capillary: 142 mg/dL — ABNORMAL HIGH (ref 70–99)
Glucose-Capillary: 196 mg/dL — ABNORMAL HIGH (ref 70–99)
Glucose-Capillary: 114 mg/dL — ABNORMAL HIGH (ref 70–99)

## 2023-11-06 MED ORDER — INSULIN ASPART 100 UNIT/ML IJ SOLN
0.0000 [IU] | Freq: Every day | INTRAMUSCULAR | Status: DC
  Administered 2023-11-13: 3 [IU] via SUBCUTANEOUS
  Filled 2023-11-06: qty 1

## 2023-11-06 MED ORDER — LORAZEPAM 2 MG/ML IJ SOLN: 2.0000 mg | Freq: Three times a day (TID) | INTRAMUSCULAR | Status: DC | PRN

## 2023-11-06 MED ORDER — DIPHENHYDRAMINE HCL 50 MG/ML IJ SOLN: 50.0000 mg | Freq: Three times a day (TID) | INTRAMUSCULAR | Status: DC | PRN

## 2023-11-06 MED ORDER — MAGNESIUM HYDROXIDE 400 MG/5ML PO SUSP: 30.0000 mL | Freq: Every day | ORAL | Status: DC | PRN

## 2023-11-06 MED ORDER — INSULIN GLARGINE 100 UNIT/ML ~~LOC~~ SOLN
25.0000 [IU] | SUBCUTANEOUS | Status: DC
Start: 2023-11-06 — End: 2023-11-11
  Administered 2023-11-06 – 2023-11-10 (×5): 25 [IU] via SUBCUTANEOUS
  Filled 2023-11-06 (×6): qty 0.25

## 2023-11-06 MED ORDER — FLUOXETINE HCL 10 MG PO CAPS
10.0000 mg | ORAL_CAPSULE | Freq: Every day | ORAL | Status: DC
  Administered 2023-11-06 – 2023-11-15 (×10): 10 mg via ORAL
  Filled 2023-11-06 (×10): qty 1

## 2023-11-06 MED ORDER — INSULIN ASPART 100 UNIT/ML IJ SOLN
0.0000 [IU] | Freq: Three times a day (TID) | INTRAMUSCULAR | Status: DC
  Administered 2023-11-06: 3 [IU] via SUBCUTANEOUS
  Administered 2023-11-06 (×2): 2 [IU] via SUBCUTANEOUS
  Administered 2023-11-07: 5 [IU] via SUBCUTANEOUS
  Administered 2023-11-07: 3 [IU] via SUBCUTANEOUS
  Administered 2023-11-08: 2 [IU] via SUBCUTANEOUS
  Administered 2023-11-08: 3 [IU] via SUBCUTANEOUS
  Administered 2023-11-08: 8 [IU] via SUBCUTANEOUS
  Administered 2023-11-09: 3 [IU] via SUBCUTANEOUS
  Administered 2023-11-09: 8 [IU] via SUBCUTANEOUS
  Administered 2023-11-09: 3 [IU] via SUBCUTANEOUS
  Administered 2023-11-10 – 2023-11-11 (×2): 2 [IU] via SUBCUTANEOUS
  Administered 2023-11-12: 8 [IU] via SUBCUTANEOUS
  Administered 2023-11-12: 2 [IU] via SUBCUTANEOUS
  Administered 2023-11-13: 3 [IU] via SUBCUTANEOUS
  Administered 2023-11-13: 2 [IU] via SUBCUTANEOUS
  Administered 2023-11-14 (×2): 3 [IU] via SUBCUTANEOUS
  Administered 2023-11-14: 5 [IU] via SUBCUTANEOUS
  Administered 2023-11-15: 8 [IU] via SUBCUTANEOUS
  Filled 2023-11-06 (×20): qty 1

## 2023-11-06 MED ORDER — TRAZODONE HCL 50 MG PO TABS
50.0000 mg | ORAL_TABLET | Freq: Every evening | ORAL | Status: DC | PRN
  Filled 2023-11-06: qty 1

## 2023-11-06 MED ORDER — HALOPERIDOL 5 MG PO TABS: 5.0000 mg | ORAL_TABLET | Freq: Three times a day (TID) | ORAL | Status: DC | PRN

## 2023-11-06 MED ORDER — ALUM & MAG HYDROXIDE-SIMETH 200-200-20 MG/5ML PO SUSP: 30.0000 mL | ORAL | Status: DC | PRN

## 2023-11-06 MED ORDER — INSULIN ASPART 100 UNIT/ML IJ SOLN
6.0000 [IU] | Freq: Three times a day (TID) | INTRAMUSCULAR | Status: DC
  Administered 2023-11-06 – 2023-11-11 (×16): 6 [IU] via SUBCUTANEOUS
  Filled 2023-11-06 (×14): qty 1

## 2023-11-06 MED ORDER — HALOPERIDOL LACTATE 5 MG/ML IJ SOLN: 10.0000 mg | Freq: Three times a day (TID) | INTRAMUSCULAR | Status: DC | PRN

## 2023-11-06 MED ORDER — DIPHENHYDRAMINE HCL 25 MG PO CAPS: 50.0000 mg | ORAL_CAPSULE | Freq: Three times a day (TID) | ORAL | Status: DC | PRN

## 2023-11-06 MED ORDER — HYDROXYZINE HCL 10 MG PO TABS: 10.0000 mg | ORAL_TABLET | Freq: Three times a day (TID) | ORAL | Status: DC | PRN

## 2023-11-06 MED ORDER — ACETAMINOPHEN 325 MG PO TABS: 650.0000 mg | ORAL_TABLET | Freq: Four times a day (QID) | ORAL | Status: DC | PRN

## 2023-11-06 MED ORDER — HALOPERIDOL LACTATE 5 MG/ML IJ SOLN: 5.0000 mg | Freq: Three times a day (TID) | INTRAMUSCULAR | Status: DC | PRN
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
## 2023-11-06 NOTE — Progress Notes (Signed)
   11/06/23 1007  Psych Admission Type (Psych Patients Only)  Admission Status Involuntary  Psychosocial Assessment  Patient Complaints None  Eye Contact Fair  Facial Expression Anxious  Affect Anxious  Speech Logical/coherent  Interaction Assertive  Motor Activity Fidgety  Appearance/Hygiene In scrubs  Behavior Characteristics Anxious;Cooperative  Mood Pleasant  Aggressive Behavior  Effect No apparent injury  Thought Process  Coherency WDL  Content WDL  Delusions None reported or observed  Perception WDL  Hallucination None reported or observed  Judgment WDL  Confusion WDL  Danger to Self  Current suicidal ideation? Denies  Danger to Others  Danger to Others None reported or observed
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
## 2023-11-06 NOTE — Plan of Care (Signed)

## 2023-11-06 NOTE — Group Note (Signed)
 Date:  11/06/2023 Time:  8:45 PM  Group Topic/Focus:  Making Healthy Choices:   The focus of this group is to help patients identify negative/unhealthy choices they were using prior to admission and identify positive/healthier coping strategies to replace them upon discharge. Personal Choices and Values:   The focus of this group is to help patients assess and explore the importance of values in their lives, how their values affect their decisions, how they express their values and what opposes their expression. Self Care:   The focus of this group is to help patients understand the importance of self-care in order to improve or restore emotional, physical, spiritual, interpersonal, and financial health.    Participation Level:  Active  Participation Quality:  Appropriate  Affect:  Appropriate  Cognitive:  Appropriate and Oriented  Insight: Appropriate  Engagement in Group:  Engaged  Modes of Intervention:  Discussion and Support  Additional Comments:  N/A  Butler LITTIE Gelineau 11/06/2023, 8:45 PM

## 2023-11-06 NOTE — Group Note (Signed)
 LCSW Group Therapy Note   Group Date: 11/06/2023 Start Time: 1355 End Time: 1445   Type of Therapy and Topic:  Group Therapy: Change and Accountability  Participation Level:  Active    Summary of Patient Progress:  The patient attended group. The patient participated during the discussion and understood topic.      Roselyn GORMAN Lento, LCSWA 11/06/2023  4:15 PM

## 2023-11-06 NOTE — Progress Notes (Signed)
 Pt was admitted to BMU via [facility] ED around 1 AM.  On assessment pt was calm and cooperative with no complaints.  Pt stated he is here due to a temporary lapse of judgment in which he took too much insulin  and work up with EMS surrounding him.  Pt denies at this time any SI or HI or AVH and denies that he was suicidal before.  Pt said he just wanted to be heard.  Pt lives with his mom and stepdad, and some step/half siblings.  Pt has a job and transportation.  Pt denies any physical complaints or diagnoses with the exception of DM Type I since he was 23 years old.  Pt was oriented to the unit and provided clean scrubs.  Pt placed on q 15 minute checks to monitor for safety.     11/06/23 0500  Psych Admission Type (Psych Patients Only)  Admission Status Involuntary  Psychosocial Assessment  Patient Complaints None  Eye Contact Fair  Facial Expression Animated  Affect Anxious  Speech Soft  Interaction Cautious  Motor Activity Other (Comment) (appropriate to circumstance)  Appearance/Hygiene In scrubs  Behavior Characteristics Cooperative;Appropriate to situation  Mood Anxious  Thought Process  Coherency WDL  Content WDL  Delusions None reported or observed  Perception WDL  Hallucination None reported or observed  Judgment WDL  Confusion WDL  Danger to Self  Current suicidal ideation? Denies  Danger to Others  Danger to Others None reported or observed

## 2023-11-06 NOTE — Plan of Care (Signed)

## 2023-11-06 NOTE — Group Note (Signed)
 Date:  11/06/2023 Time:  5:05 PM  Group Topic/Focus:  Diagnosis Education:   The focus of this group is to discuss the major disorders that patients maybe diagnosed with.  Group discusses the importance of knowing what one's diagnosis is so that one can understand treatment and better advocate for oneself.    Participation Level:  Did Not Attend   Camellia HERO Hektor Huston 11/06/2023, 5:05 PM

## 2023-11-07 DIAGNOSIS — F329 Major depressive disorder, single episode, unspecified: Secondary | ICD-10-CM

## 2023-11-07 LAB — GLUCOSE, CAPILLARY
Glucose-Capillary: 102 mg/dL — ABNORMAL HIGH (ref 70–99)
Glucose-Capillary: 186 mg/dL — ABNORMAL HIGH (ref 70–99)
Glucose-Capillary: 201 mg/dL — ABNORMAL HIGH (ref 70–99)
Glucose-Capillary: 151 mg/dL — ABNORMAL HIGH (ref 70–99)

## 2023-11-07 NOTE — Inpatient Diabetes Management (Signed)
 Inpatient Diabetes Program Recommendations  AACE/ADA: New Consensus Statement on Inpatient Glycemic Control   Target Ranges:  Prepandial:   less than 140 mg/dL      Peak postprandial:   less than 180 mg/dL (1-2 hours)      Critically ill patients:  140 - 180 mg/dL    Latest Reference Range & Units 11/06/23 06:23 11/06/23 11:45 11/06/23 16:34 11/06/23 20:02 11/07/23 07:15  Glucose-Capillary 70 - 99 mg/dL 857 (H) 857 (H) 803 (H) 114 (H) 102 (H)    Review of Glycemic Control  Diabetes history: DM1 Outpatient Diabetes medications: Lantus  12 units BID, Novolog  1 unit per 8 grams of carbs, Novolog  for correction (1 unit drops glucose 30 mg/dl) Current orders for Inpatient glycemic control: Lantus  25 units Q24H, Novolog  0-15 units TID with meals, Novolog  0-5 units at bedtime, Novolog  6 units TID with meals  Inpatient Diabetes Program Recommendations:    Inpatient DM regimen: Agree with current insulin  orders.  Outpatient DM: At discharge, would recommend to discharge patient on same insulin  regimen as ordered while inpatient on day of discharge.  At discharge, if patient is able to get medications from [facility], please provide Rx for Basaglar  pens (#[id number]), Humalog Kwikpens [phone]), and insulin  pen needles [phone]).   NOTE: Patient admitted with intentional overdose with insulin . Patient has Type 1 DM and will require insulin . Patient has no insurance.  Inpatient diabetes coordinator spoke with patient on 11/04/23.  Placed consult for TOC to assist with patient getting medications at discharge.   Thanks, Earnie Gainer, RN, MSN, CDCES Diabetes Coordinator Inpatient Diabetes Program [phone] (Team Pager from 8am to 5pm)

## 2023-11-07 NOTE — Plan of Care (Signed)

## 2023-11-07 NOTE — Group Note (Signed)
 Recreation Therapy Group Note   Group Topic:Other  Group Date: 11/07/2023 Start Time: 1455 End Time: 1540 Facilitators: Celestia Jeoffrey FORBES ARTICE, CTRS Location: Craft Room  Activity Description/Intervention: Therapeutic Drumming. Patients with peers and staff were given the opportunity to engage in a leader facilitated [facility] Group Empowerment Drumming Circle with staff from the [employer], in partnership with [employer]. Teaching laboratory technician and trained [employer], Norleen Mon leading with LRT observing and documenting intervention and pt response. This evidenced-based practice targets 7 areas of health and wellbeing in the human experience including: stress-reduction, exercise, self-expression, camaraderie/support, nurturing, spirituality, and music-making (leisure).    Goal Area(s) Addresses:  Patient will engage in pro-social way in music group.  Patient will follow directions of drum leader on the first prompt. Patient will demonstrate no behavioral issues during group.  Patient will identify if a reduction in stress level occurs as a result of participation in therapeutic drum circle.     Affect/Mood: Appropriate   Participation Level: Active and Engaged   Participation Quality: Independent   Behavior: Appropriate, Calm, and Cooperative   Speech/Thought Process: Coherent   Insight: Good   Judgement: Good   Modes of Intervention: Music   Patient Response to Interventions:  Attentive, Engaged, and Receptive   Education Outcome:  Acknowledges education   Clinical Observations/Individualized Feedback: Jaelon was active in their participation of session activities and group discussion.    Plan: Continue to engage patient in RT group sessions 2-3x/week.   Jeoffrey FORBES Celestia, LRT, CTRS 11/07/2023 4:49 PM

## 2023-11-07 NOTE — Progress Notes (Signed)
 [facility] MD Progress Note  11/07/2023 10:26 AM Jacob Burton  MRN:  [id number]   Subjective:  Chart reviewed, case discussed in multidisciplinary meeting, patient seen during rounds.   On interview today patient is noted to be seated on his bed.  He is pleasant and cooperative.  Patient initially presented with worsening depressive symptoms including lack of motivation, anhedonia and intentional overdose with insulin .  He denies depressive symptoms today and reports that overdose of insulin  was accidental.  He has been compliant with psychotropic medication, fluoxetine  10 mg once daily and is tolerating this well.  He rates depression as 0 out of 10 and anxiety as 0 out of 10 today.  He denies SI/HI/plan and denies hallucinations.  He endorses history of suicidal ideation a long time ago.  He does not voice any concerns or complaints today.  Sleep: Good  Appetite:  Good  Past Psychiatric History: see h&P Family History: History reviewed. No pertinent family history. Social History:  Social History   Substance and Sexual Activity  Alcohol Use Yes   Alcohol/week: 1.0 standard drink of alcohol   Types: 1 Cans of beer per week     Social History   Substance and Sexual Activity  Drug Use No    Social History   Socioeconomic History   Marital status: Significant Other    Spouse name: Not on file   Number of children: Not on file   Years of education: Not on file   Highest education level: Not on file  Occupational History   Not on file  Tobacco Use   Smoking status: Never   Smokeless tobacco: Never  Vaping Use   Vaping status: Never Used  Substance and Sexual Activity   Alcohol use: Yes    Alcohol/week: 1.0 standard drink of alcohol    Types: 1 Cans of beer per week   Drug use: No   Sexual activity: Yes  Other Topics Concern   Not on file  Social History Narrative   Not on file   Social Drivers of Health   Financial Resource Strain: Low Risk  (11/01/2023)   Received  from [facility]   Overall Financial Resource Strain (CARDIA)    Difficulty of Paying Living Expenses: Not very hard  Food Insecurity: No Food Insecurity (11/06/2023)   Hunger Vital Sign    Worried About Running Out of Food in the Last Year: Never true    Ran Out of Food in the Last Year: Never true  Transportation Needs: No Transportation Needs (11/06/2023)   PRAPARE - Administrator, Civil Service (Medical): No    Lack of Transportation (Non-Medical): No  Physical Activity: Not on file  Stress: Stress Concern Present (03/24/2021)   Received from [facility] - Occupational Stress Questionnaire    Feeling of Stress : To some extent  Social Connections: Not on file   Past Medical History:  Past Medical History:  Diagnosis Date   Diabetes mellitus without complication (HCC)    Insulin  Pump   History reviewed. No pertinent surgical history.  Current Medications: Current Facility-Administered Medications  Medication Dose Route Frequency Provider Last Rate Last Admin   acetaminophen  (TYLENOL ) tablet 650 mg  650 mg Oral Q6H PRN Smith, Annie B, NP       alum & mag hydroxide-simeth (MAALOX/MYLANTA) 200-200-20 MG/5ML suspension 30 mL  30 mL Oral Q4H PRN Smith, Annie B, NP  haloperidol  (HALDOL ) tablet 5 mg  5 mg Oral TID PRN Smith, Annie B, NP       And   diphenhydrAMINE  (BENADRYL ) capsule 50 mg  50 mg Oral TID PRN Smith, Annie B, NP       haloperidol  lactate (HALDOL ) injection 5 mg  5 mg Intramuscular TID PRN Smith, Annie B, NP       And   diphenhydrAMINE  (BENADRYL ) injection 50 mg  50 mg Intramuscular TID PRN Smith, Annie B, NP       And   LORazepam  (ATIVAN ) injection 2 mg  2 mg Intramuscular TID PRN Smith, Annie B, NP       haloperidol  lactate (HALDOL ) injection 10 mg  10 mg Intramuscular TID PRN Smith, Annie B, NP       And   diphenhydrAMINE  (BENADRYL ) injection 50 mg  50 mg Intramuscular TID  PRN Smith, Annie B, NP       And   LORazepam  (ATIVAN ) injection 2 mg  2 mg Intramuscular TID PRN Smith, Annie B, NP       FLUoxetine  (PROZAC ) capsule 10 mg  10 mg Oral Daily Smith, Annie B, NP   10 mg at 11/07/23 9183   hydrOXYzine  (ATARAX ) tablet 10 mg  10 mg Oral TID PRN Smith, Annie B, NP       insulin  aspart (novoLOG ) injection 0-15 Units  0-15 Units Subcutaneous TID WC Smith, Annie B, NP   3 Units at 11/06/23 1653   insulin  aspart (novoLOG ) injection 0-5 Units  0-5 Units Subcutaneous QHS Smith, Annie B, NP       insulin  aspart (novoLOG ) injection 6 Units  6 Units Subcutaneous TID WC Smith, Annie B, NP   6 Units at 11/07/23 9182   insulin  glargine (LANTUS ) injection 25 Units  25 Units Subcutaneous Q24H Smith, Annie B, NP   25 Units at 11/06/23 1448   magnesium  hydroxide (MILK OF MAGNESIA) suspension 30 mL  30 mL Oral Daily PRN Smith, Annie B, NP       traZODone  (DESYREL ) tablet 50 mg  50 mg Oral QHS PRN Smith, Annie B, NP        Lab Results:  Results for orders placed or performed during the hospital encounter of 11/06/23 (from the past 48 hours)  Glucose, capillary     Status: Abnormal   Collection Time: 11/06/23  6:23 AM  Result Value Ref Range   Glucose-Capillary 142 (H) 70 - 99 mg/dL    Comment: Glucose reference range applies only to samples taken after fasting for at least 8 hours.  Glucose, capillary     Status: Abnormal   Collection Time: 11/06/23 11:45 AM  Result Value Ref Range   Glucose-Capillary 142 (H) 70 - 99 mg/dL    Comment: Glucose reference range applies only to samples taken after fasting for at least 8 hours.  Comprehensive metabolic panel     Status: Abnormal   Collection Time: 11/06/23  3:48 PM  Result Value Ref Range   Sodium 135 135 - 145 mmol/L   Potassium 4.5 3.5 - 5.1 mmol/L   Chloride 97 (L) 98 - 111 mmol/L   CO2 27 22 - 32 mmol/L   Glucose, Bld 215 (H) 70 - 99 mg/dL    Comment: Glucose reference range applies only to samples taken after fasting for at  least 8 hours.   BUN 18 6 - 20 mg/dL   Creatinine, Ser 9.18 0.61 - 1.24 mg/dL   Calcium 9.1 8.9 - 10.3  mg/dL   Total Protein 6.6 6.5 - 8.1 g/dL   Albumin 3.5 3.5 - 5.0 g/dL   AST 628 (H) 15 - 41 U/L   ALT 526 (H) 0 - 44 U/L   Alkaline Phosphatase 83 38 - 126 U/L   Total Bilirubin 0.9 0.0 - 1.2 mg/dL   GFR, Estimated >39 >39 mL/min    Comment: (NOTE) Calculated using the CKD-EPI Creatinine Equation (2021)    Anion gap 11 5 - 15    Comment: Performed at [facility], [address] [place], [place] [address]  Glucose, capillary     Status: Abnormal   Collection Time: 11/06/23  4:34 PM  Result Value Ref Range   Glucose-Capillary 196 (H) 70 - 99 mg/dL    Comment: Glucose reference range applies only to samples taken after fasting for at least 8 hours.  Glucose, capillary     Status: Abnormal   Collection Time: 11/06/23  8:02 PM  Result Value Ref Range   Glucose-Capillary 114 (H) 70 - 99 mg/dL    Comment: Glucose reference range applies only to samples taken after fasting for at least 8 hours.  Glucose, capillary     Status: Abnormal   Collection Time: 11/07/23  7:15 AM  Result Value Ref Range   Glucose-Capillary 102 (H) 70 - 99 mg/dL    Comment: Glucose reference range applies only to samples taken after fasting for at least 8 hours.    Blood Alcohol level:  Lab Results  Component Value Date   [facility] <15 11/04/2023   ETH <10 11/21/2017    Metabolic Disorder Labs: Lab Results  Component Value Date   HGBA1C 10.3 (H) 11/04/2023   MPG 248.91 11/04/2023   No results found for: PROLACTIN No results found for: CHOL, TRIG, HDL, CHOLHDL, VLDL, LDLCALC  Physical Findings: AIMS:  , ,  ,  ,    CIWA:    COWS:      Psychiatric Specialty Exam:  Presentation  General Appearance:  Appropriate for Environment  Eye Contact: Fair  Speech: Clear and Coherent  Speech Volume: Normal    Mood and Affect   Mood: Euthymic  Affect: Appropriate   Thought Process  Thought Processes: Coherent  Descriptions of Associations:No data recorded Orientation:Full (Time, Place and Person)  Thought Content:WDL  Hallucinations:Hallucinations: None  Ideas of Reference:None  Suicidal Thoughts:Suicidal Thoughts: No  Homicidal Thoughts:Homicidal Thoughts: No   Sensorium  Memory: Immediate Good; Recent Good; Remote Good  Judgment: Fair  Insight: Fair   Chartered certified accountant: Fair  Attention Span: Fair  Recall: Good  Fund of Knowledge: Good  Language: Good   Psychomotor Activity  Psychomotor Activity: Psychomotor Activity: Normal  Musculoskeletal: Strength & Muscle Tone: within normal limits Gait & Station: normal Assets  Assets: Social Support; Manufacturing systems engineer; Desire for Improvement    Physical Exam: Physical Exam ROS Blood pressure 109/73, pulse 96, temperature 98.6 F (37 C), resp. rate 16, height 5' 9 (1.753 m), weight 65.8 kg, SpO2 98%. Body mass index is 21.41 kg/m.  Diagnosis: Principal Problem:   MDD (major depressive disorder)   PLAN: Safety and Monitoring:  -- Voluntary admission to inpatient psychiatric unit for safety, stabilization and treatment  -- Daily contact with patient to assess and evaluate symptoms and progress in treatment  -- Patient's case to be discussed in multi-disciplinary team meeting  -- Observation Level : q15 minute checks  -- Vital signs:  q12 hours  -- Precautions: suicide, elopement, and assault -- Encouraged  patient to participate in unit milieu and in scheduled group therapies  2. Psychiatric Diagnoses and Treatment:  MDD, recurrent, severe Continue Prozac  10 mg once daily    -- The risks/benefits/side-effects/alternatives to this medication were discussed in detail with the patient and time was given for questions. The patient consents to medication trial.                -- Metabolic  profile and EKG monitoring obtained while on an atypical antipsychotic (BMI: Lipid Panel: HbgA1c: QTc:)              -- Encouraged patient to participate in unit milieu and in scheduled group therapies    3. Medical Issues Being Addressed:  DM type I   4. Discharge Planning:   -- Social work and case management to assist with discharge planning and identification of hospital follow-up needs prior to discharge  -- Estimated LOS: 3-4 days  [employer], PA-C 11/07/2023, 10:26 AM

## 2023-11-07 NOTE — Plan of Care (Signed)
   Problem: Education: Goal: Knowledge of [facility] Education information/materials will improve Outcome: Progressing Goal: Emotional status will improve Outcome: Progressing Goal: Mental status will improve Outcome: Progressing Goal: Verbalization of understanding the information provided will improve Outcome: Progressing

## 2023-11-07 NOTE — Group Note (Signed)
 Date:  11/07/2023 Time:  8:54 PM  Group Topic/Focus:  Wrap-Up Group:   The focus of this group is to help patients review their daily goal of treatment and discuss progress on daily workbooks.    Participation Level:  Active  Participation Quality:  Appropriate  Affect:  Appropriate  Cognitive:  Alert  Insight: Appropriate  Engagement in Group:  Engaged  Modes of Intervention:  Discussion  Additional Comments:    Leigh VEAR Pais 11/07/2023, 8:54 PM

## 2023-11-07 NOTE — BH IP Treatment Plan (Signed)
 Interdisciplinary Treatment and Diagnostic Plan Update  11/07/2023 Time of Session: 10:39 Jacob Burton MRN: [id number]  Principal Diagnosis: MDD (major depressive disorder)  Secondary Diagnoses: Principal Problem:   MDD (major depressive disorder)   Current Medications:  Current Facility-Administered Medications  Medication Dose Route Frequency Provider Last Rate Last Admin   acetaminophen  (TYLENOL ) tablet 650 mg  650 mg Oral Q6H PRN Smith, Annie B, NP       alum & mag hydroxide-simeth (MAALOX/MYLANTA) 200-200-20 MG/5ML suspension 30 mL  30 mL Oral Q4H PRN Smith, Annie B, NP       haloperidol  (HALDOL ) tablet 5 mg  5 mg Oral TID PRN Smith, Annie B, NP       And   diphenhydrAMINE  (BENADRYL ) capsule 50 mg  50 mg Oral TID PRN Smith, Annie B, NP       haloperidol  lactate (HALDOL ) injection 5 mg  5 mg Intramuscular TID PRN Smith, Annie B, NP       And   diphenhydrAMINE  (BENADRYL ) injection 50 mg  50 mg Intramuscular TID PRN Smith, Annie B, NP       And   LORazepam  (ATIVAN ) injection 2 mg  2 mg Intramuscular TID PRN Smith, Annie B, NP       haloperidol  lactate (HALDOL ) injection 10 mg  10 mg Intramuscular TID PRN Smith, Annie B, NP       And   diphenhydrAMINE  (BENADRYL ) injection 50 mg  50 mg Intramuscular TID PRN Smith, Annie B, NP       And   LORazepam  (ATIVAN ) injection 2 mg  2 mg Intramuscular TID PRN Smith, Annie B, NP       FLUoxetine  (PROZAC ) capsule 10 mg  10 mg Oral Daily Smith, Annie B, NP   10 mg at 11/07/23 0816   hydrOXYzine  (ATARAX ) tablet 10 mg  10 mg Oral TID PRN Smith, Annie B, NP       insulin  aspart (novoLOG ) injection 0-15 Units  0-15 Units Subcutaneous TID WC Smith, Annie B, NP   3 Units at 11/06/23 1653   insulin  aspart (novoLOG ) injection 0-5 Units  0-5 Units Subcutaneous QHS Smith, Annie B, NP       insulin  aspart (novoLOG ) injection 6 Units  6 Units Subcutaneous TID WC Smith, Annie B, NP   6 Units at 11/07/23 0817   insulin  glargine (LANTUS ) injection 25 Units   25 Units Subcutaneous Q24H Smith, Annie B, NP   25 Units at 11/06/23 1448   magnesium  hydroxide (MILK OF MAGNESIA) suspension 30 mL  30 mL Oral Daily PRN Smith, Annie B, NP       traZODone  (DESYREL ) tablet 50 mg  50 mg Oral QHS PRN Smith, Annie B, NP       PTA Medications: Medications Prior to Admission  Medication Sig Dispense Refill Last Dose/Taking   acetaminophen  (TYLENOL ) 325 MG tablet Take 650 mg by mouth every 4 (four) hours as needed for mild pain (pain score 1-3).   Past Month   insulin  aspart (NOVOLOG ) 100 UNIT/ML injection See admin instructions. Inject up to 50u under the skin daily as directed   Taking   insulin  glargine (LANTUS ) 100 UNIT/ML injection Inject 12 Units into the skin 2 (two) times daily.   11/04/2023   vitamin C (ASCORBIC ACID) 500 MG tablet Take 500 mg by mouth daily.   Past Week   Zinc Citrate-Phytase (ZYTAZE) 25-500 MG CAPS Take 1 capsule by mouth daily.   11/03/2023   insulin  lispro (HUMALOG) 100  UNIT/ML injection See admin instructions. Use up to 90u daily via insulin  pump (Patient not taking: Reported on 11/04/2023)      metoCLOPramide  (REGLAN ) 10 MG tablet Take 1 tablet (10 mg total) by mouth every 8 (eight) hours as needed for nausea. (Patient not taking: Reported on 11/04/2023) 12 tablet 0    ondansetron  (ZOFRAN -ODT) 4 MG disintegrating tablet Take 1 tablet (4 mg total) by mouth every 8 (eight) hours as needed for nausea or vomiting. (Patient not taking: Reported on 11/04/2023) 12 tablet 0     Patient Stressors:    Patient Strengths:    Treatment Modalities: Medication Management, Group therapy, Case management,  1 to 1 session with clinician, Psychoeducation, Recreational therapy.   Physician Treatment Plan for Primary Diagnosis: MDD (major depressive disorder) Long Term Goal(s): Improvement in symptoms so as ready for discharge   Short Term Goals: Ability to identify changes in lifestyle to reduce recurrence of condition will improve Ability to verbalize  feelings will improve Ability to disclose and discuss suicidal ideas Ability to demonstrate self-control will improve  Medication Management: Evaluate patient's response, side effects, and tolerance of medication regimen.  Therapeutic Interventions: 1 to 1 sessions, Unit Group sessions and Medication administration.  Evaluation of Outcomes: Not Met  Physician Treatment Plan for Secondary Diagnosis: Principal Problem:   MDD (major depressive disorder)  Long Term Goal(s): Improvement in symptoms so as ready for discharge   Short Term Goals: Ability to identify changes in lifestyle to reduce recurrence of condition will improve Ability to verbalize feelings will improve Ability to disclose and discuss suicidal ideas Ability to demonstrate self-control will improve     Medication Management: Evaluate patient's response, side effects, and tolerance of medication regimen.  Therapeutic Interventions: 1 to 1 sessions, Unit Group sessions and Medication administration.  Evaluation of Outcomes: Not Met   RN Treatment Plan for Primary Diagnosis: MDD (major depressive disorder) Long Term Goal(s): Knowledge of disease and therapeutic regimen to maintain health will improve  Short Term Goals: Ability to remain free from injury will improve, Ability to verbalize frustration and anger appropriately will improve, Ability to demonstrate self-control, Ability to participate in decision making will improve, Ability to verbalize feelings will improve, Ability to disclose and discuss suicidal ideas, Ability to identify and develop effective coping behaviors will improve, and Compliance with prescribed medications will improve  Medication Management: RN will administer medications as ordered by provider, will assess and evaluate patient's response and provide education to patient for prescribed medication. RN will report any adverse and/or side effects to prescribing provider.  Therapeutic Interventions:  1 on 1 counseling sessions, Psychoeducation, Medication administration, Evaluate responses to treatment, Monitor vital signs and CBGs as ordered, Perform/monitor CIWA, COWS, AIMS and Fall Risk screenings as ordered, Perform wound care treatments as ordered.  Evaluation of Outcomes: Not Met   LCSW Treatment Plan for Primary Diagnosis: MDD (major depressive disorder) Long Term Goal(s): Safe transition to appropriate next level of care at discharge, Engage patient in therapeutic group addressing interpersonal concerns.  Short Term Goals: Engage patient in aftercare planning with referrals and resources, Increase social support, Increase ability to appropriately verbalize feelings, Increase emotional regulation, Facilitate acceptance of mental health diagnosis and concerns, and Increase skills for wellness and recovery  Therapeutic Interventions: Assess for all discharge needs, 1 to 1 time with Social worker, Explore available resources and support systems, Assess for adequacy in community support network, Educate family and significant other(s) on suicide prevention, Complete Psychosocial Assessment, Interpersonal group therapy.  Evaluation of Outcomes: Not Met   Progress in Treatment: Attending groups: Yes. Participating in groups: Yes. Taking medication as prescribed: Yes. Toleration medication: Yes. Family/Significant other contact made: No, will contact:  when given permission.  Patient understands diagnosis: Yes. Discussing patient identified problems/goals with staff: Yes. Medical problems stabilized or resolved: Yes. Denies suicidal/homicidal ideation: Yes. Issues/concerns per patient self-inventory: No. Other: none.   New problem(s) identified: No, Describe:  none identified.  New Short Term/Long Term Goal(s): medication management for mood stabilization; elimination of SI thoughts; development of comprehensive mental wellness plan.  Patient Goals:  I don't have any goals. I'm  good.   Discharge Plan or Barriers: CSW will assist pt with development of an appropriate aftercare/discharge plan.   Reason for Continuation of Hospitalization: Depression Medication stabilization Suicidal ideation  Estimated Length of Stay: 1-7 days  Last 3 [place] Suicide Severity Risk Score: Flowsheet Row Admission (Current) from 11/06/2023 in [facility] INPATIENT BEHAV[facility]AL MEDICINE ED from 11/04/2023 in [facility] Emergency Department at [facility] ED from 09/29/2022 in [facility] Emergency Department at [facility]  C-SSRS RISK CATEGORY High Risk High Risk No Risk    Last PHQ 2/9 Scores:     No data to display          Scribe for Treatment Team: Nadara JONELLE Fam, LCSW 11/07/2023 11:33 AM
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
## 2023-11-07 NOTE — Progress Notes (Signed)
   11/07/23 0923  Psych Admission Type (Psych Patients Only)  Admission Status Involuntary  Psychosocial Assessment  Patient Complaints None  Eye Contact Fair  Facial Expression Anxious  Affect Anxious  Speech Logical/coherent  Interaction Assertive  Motor Activity Fidgety  Appearance/Hygiene In scrubs  Behavior Characteristics Cooperative  Mood Pleasant  Aggressive Behavior  Effect No apparent injury  Thought Process  Coherency WDL  Content WDL  Delusions None reported or observed  Perception WDL  Hallucination None reported or observed  Judgment WDL  Confusion WDL  Danger to Self  Current suicidal ideation? Denies  Danger to Others  Danger to Others None reported or observed

## 2023-11-07 NOTE — Group Note (Signed)
 Recreation Therapy Group Note   Group Topic:Health and Wellness  Group Date: 11/07/2023 Start Time: 1100 End Time: 1135 Facilitators: Celestia Jeoffrey BRAVO, LRT, CTRS Location: Courtyard  Group Description: [employer]. LRT and patients played games of basketball, drew with chalk, and played corn hole while outside in the courtyard while getting fresh air and sunlight. Music was being played in the background. LRT and peers conversed about different games they have played before, what they do in their free time and anything else that is on their minds. LRT encouraged pts to drink water after being outside, sweating and getting their heart rate up.  Goal Area(s) Addressed: Patient will build on frustration tolerance skills. Patients will partake in a competitive play game with peers. Patients will gain knowledge of new leisure interest/hobby.    Affect/Mood: N/A   Participation Level: Did not attend    Clinical Observations/Individualized Feedback: Patient did not attend group.   Plan: Continue to engage patient in RT group sessions 2-3x/week.   Jeoffrey BRAVO Celestia, LRT, CTRS 11/07/2023 11:47 AM

## 2023-11-07 NOTE — Group Note (Signed)
 Date:  11/07/2023 Time:  3:30 PM  Group Topic/Focus:  Goals Group:   The focus of this group is to help patients establish daily goals to achieve during treatment and discuss how the patient can incorporate goal setting into their daily lives to aide in recovery.    Participation Level:  Did Not Attend  Participation Quality:  Dannie CHRISTELLA Hover 11/07/2023, 3:30 PM

## 2023-11-07 NOTE — Group Note (Signed)
 LCSW Group Therapy Note  Group Date: 11/07/2023 Start Time: 1300 End Time: 1400   Type of Therapy and Topic:  Group Therapy - How To Cope with Nervousness about Discharge   Participation Level:  None   Description of Group This process group involved identification of patients' feelings about discharge. Some of them are scheduled to be discharged soon, while others are new admissions, but each of them was asked to share thoughts and feelings surrounding discharge from the hospital. One common theme was that they are excited at the prospect of going home, while another was that many of them are apprehensive about sharing why they were hospitalized. Patients were given the opportunity to discuss these feelings with their peers in preparation for discharge.  Therapeutic Goals  Patient will identify their overall feelings about pending discharge. Patient will think about how they might proactively address issues that they believe will once again arise once they get home (i.e. with parents). Patients will participate in discussion about having hope for change.   Summary of Patient Progress:   Patient was present in group. Patient did not participate in group discussion.    Therapeutic Modalities Cognitive Behavioral Therapy   Sherryle JINNY Margo, LCSW 11/07/2023  3:13 PM

## 2023-11-08 LAB — GLUCOSE, CAPILLARY
Glucose-Capillary: 259 mg/dL — ABNORMAL HIGH (ref 70–99)
Glucose-Capillary: 141 mg/dL — ABNORMAL HIGH (ref 70–99)
Glucose-Capillary: 187 mg/dL — ABNORMAL HIGH (ref 70–99)
Glucose-Capillary: 185 mg/dL — ABNORMAL HIGH (ref 70–99)

## 2023-11-08 NOTE — Progress Notes (Signed)
   11/07/23 2000  Psych Admission Type (Psych Patients Only)  Admission Status Involuntary  Psychosocial Assessment  Patient Complaints None  Eye Contact Fair  Facial Expression Anxious  Affect Anxious  Speech Logical/coherent  Interaction Assertive  Motor Activity Fidgety  Appearance/Hygiene In scrubs  Behavior Characteristics Cooperative;Appropriate to situation  Mood Pleasant  Aggressive Behavior  Effect No apparent injury  Thought Process  Coherency WDL  Content WDL  Delusions None reported or observed  Perception WDL  Hallucination None reported or observed  Judgment WDL  Confusion WDL  Danger to Self  Current suicidal ideation? Denies  Danger to Others  Danger to Others None reported or observed

## 2023-11-08 NOTE — BHH Suicide Risk Assessment (Signed)
 [facility] INPATIENT:  Family/Significant Other Suicide Prevention Education  Suicide Prevention Education:  Education Completed; Concha Cory, girlfriend, [phone],  (name of  has been identified by the patient as the family member/significant other with whom the patient will be residing, and identified as the person(s) who will aid the patient in the event of a mental health crisis (suicidal ideations/suicide attempt).  With written consent from the patient, the family member/significant other has been provided the following suicide prevention education, prior to the and/or following the discharge of the patient.  The suicide prevention education provided includes the following: Suicide risk factors Suicide prevention and interventions National Suicide Hotline telephone number [facility] assessment telephone number [facility] Emergency Assistance 911 [facility] and/or Residential Mobile Crisis Unit telephone number  Request made of family/significant other to: Remove weapons (e.g., guns, rifles, knives), all items previously/currently identified as safety concern.   Remove drugs/medications (over-the-counter, prescriptions, illicit drugs), all items previously/currently identified as a safety concern.  The family member/significant other verbalizes understanding of the suicide prevention education information provided.  The family member/significant other agrees to remove the items of safety concern listed above.  According to girlfriend that are no current safety concerns. Girlfriend also confirmed that the patient does not have access to weapons and at discharge she will provide transportation.   Jacob Burton Fam 11/08/2023, 3:22 PM

## 2023-11-08 NOTE — Progress Notes (Signed)
   11/08/23 1300  Psych Admission Type (Psych Patients Only)  Admission Status Involuntary  Psychosocial Assessment  Patient Complaints None  Eye Contact Fair  Facial Expression Animated  Affect Appropriate to circumstance  Speech Logical/coherent  Interaction Assertive  Motor Activity Other (Comment) (appropriate for developmental age)  Appearance/Hygiene Unremarkable  Behavior Characteristics Cooperative  Mood Pleasant (Patient writes his goal for today is to organize a plan for discharge.)  Thought Process  Coherency WDL  Content WDL  Delusions None reported or observed  Perception WDL  Hallucination None reported or observed  Judgment WDL  Confusion None  Danger to Self  Current suicidal ideation? Denies  Danger to Others  Danger to Others None reported or observed

## 2023-11-08 NOTE — Progress Notes (Signed)
 [facility] MD Progress Note  11/08/2023 6:23 PM COURVOISIER HAMBLEN  MRN:  [id number]   Subjective:  Chart reviewed, case discussed in multidisciplinary meeting, patient seen during rounds.   9/23: On interview today patient is noted to be seated on his bed.  He is calm and cooperative.  Patient denies current depressive symptoms or anxiety.  He is sleeping well and appetite is normal.  He denies SI/HI/plan and denies hallucinations.  He has been compliant with current medication regimen of Prozac  10 mg once daily and denies adverse effects.  Patient states he is looking to set up outpatient follow-up appointments on his own but will also accept help from social work as needed. He does not voice any concerns or complaints today.   9/22: On interview today patient is noted to be seated on his bed.  He is pleasant and cooperative.  Patient initially presented with worsening depressive symptoms including lack of motivation, anhedonia and intentional overdose with insulin .  He denies depressive symptoms today and reports that overdose of insulin  was accidental.  He has been compliant with psychotropic medication, fluoxetine  10 mg once daily and is tolerating this well.  He rates depression as 0 out of 10 and anxiety as 0 out of 10 today.  He denies SI/HI/plan and denies hallucinations.  He endorses history of suicidal ideation a long time ago.  He does not voice any concerns or complaints today.  Sleep: Good  Appetite:  Good  Past Psychiatric History: see h&P Family History: History reviewed. No pertinent family history. Social History:  Social History   Substance and Sexual Activity  Alcohol Use Yes   Alcohol/week: 1.0 standard drink of alcohol   Types: 1 Cans of beer per week     Social History   Substance and Sexual Activity  Drug Use No    Social History   Socioeconomic History   Marital status: Significant Other    Spouse name: Not on file   Number of children: Not on file   Years of  education: Not on file   Highest education level: Not on file  Occupational History   Not on file  Tobacco Use   Smoking status: Never   Smokeless tobacco: Never  Vaping Use   Vaping status: Never Used  Substance and Sexual Activity   Alcohol use: Yes    Alcohol/week: 1.0 standard drink of alcohol    Types: 1 Cans of beer per week   Drug use: No   Sexual activity: Yes  Other Topics Concern   Not on file  Social History Narrative   Not on file   Social Drivers of Health   Financial Resource Strain: Low Risk  (11/01/2023)   Received from [facility]   Overall Financial Resource Strain (CARDIA)    Difficulty of Paying Living Expenses: Not very hard  Food Insecurity: No Food Insecurity (11/06/2023)   Hunger Vital Sign    Worried About Running Out of Food in the Last Year: Never true    Ran Out of Food in the Last Year: Never true  Transportation Needs: No Transportation Needs (11/06/2023)   PRAPARE - Administrator, Civil Service (Medical): No    Lack of Transportation (Non-Medical): No  Physical Activity: Not on file  Stress: Stress Concern Present (03/24/2021)   Received from [facility] - Occupational Stress Questionnaire    Feeling of Stress : To some extent  Social  Connections: Not on file   Past Medical History:  Past Medical History:  Diagnosis Date   Diabetes mellitus without complication (HCC)    Insulin  Pump   History reviewed. No pertinent surgical history.  Current Medications: Current Facility-Administered Medications  Medication Dose Route Frequency Provider Last Rate Last Admin   acetaminophen  (TYLENOL ) tablet 650 mg  650 mg Oral Q6H PRN Smith, Annie B, NP       alum & mag hydroxide-simeth (MAALOX/MYLANTA) 200-200-20 MG/5ML suspension 30 mL  30 mL Oral Q4H PRN Smith, Annie B, NP       haloperidol  (HALDOL ) tablet 5 mg  5 mg Oral TID PRN Smith, Annie B, NP       And    diphenhydrAMINE  (BENADRYL ) capsule 50 mg  50 mg Oral TID PRN Smith, Annie B, NP       haloperidol  lactate (HALDOL ) injection 5 mg  5 mg Intramuscular TID PRN Smith, Annie B, NP       And   diphenhydrAMINE  (BENADRYL ) injection 50 mg  50 mg Intramuscular TID PRN Smith, Annie B, NP       And   LORazepam  (ATIVAN ) injection 2 mg  2 mg Intramuscular TID PRN Smith, Annie B, NP       haloperidol  lactate (HALDOL ) injection 10 mg  10 mg Intramuscular TID PRN Smith, Annie B, NP       And   diphenhydrAMINE  (BENADRYL ) injection 50 mg  50 mg Intramuscular TID PRN Smith, Annie B, NP       And   LORazepam  (ATIVAN ) injection 2 mg  2 mg Intramuscular TID PRN Smith, Annie B, NP       FLUoxetine  (PROZAC ) capsule 10 mg  10 mg Oral Daily Smith, Annie B, NP   10 mg at 11/08/23 0835   hydrOXYzine  (ATARAX ) tablet 10 mg  10 mg Oral TID PRN Smith, Annie B, NP       insulin  aspart (novoLOG ) injection 0-15 Units  0-15 Units Subcutaneous TID WC Smith, Annie B, NP   3 Units at 11/08/23 1706   insulin  aspart (novoLOG ) injection 0-5 Units  0-5 Units Subcutaneous QHS Smith, Annie B, NP       insulin  aspart (novoLOG ) injection 6 Units  6 Units Subcutaneous TID WC Smith, Annie B, NP   6 Units at 11/08/23 1708   insulin  glargine (LANTUS ) injection 25 Units  25 Units Subcutaneous Q24H Smith, Annie B, NP   25 Units at 11/08/23 1421   magnesium  hydroxide (MILK OF MAGNESIA) suspension 30 mL  30 mL Oral Daily PRN Smith, Annie B, NP       traZODone  (DESYREL ) tablet 50 mg  50 mg Oral QHS PRN Smith, Annie B, NP        Lab Results:  Results for orders placed or performed during the hospital encounter of 11/06/23 (from the past 48 hours)  Glucose, capillary     Status: Abnormal   Collection Time: 11/06/23  8:02 PM  Result Value Ref Range   Glucose-Capillary 114 (H) 70 - 99 mg/dL    Comment: Glucose reference range applies only to samples taken after fasting for at least 8 hours.  Glucose, capillary     Status: Abnormal   Collection  Time: 11/07/23  7:15 AM  Result Value Ref Range   Glucose-Capillary 102 (H) 70 - 99 mg/dL    Comment: Glucose reference range applies only to samples taken after fasting for at least 8 hours.  Glucose, capillary  Status: Abnormal   Collection Time: 11/07/23 11:40 AM  Result Value Ref Range   Glucose-Capillary 186 (H) 70 - 99 mg/dL    Comment: Glucose reference range applies only to samples taken after fasting for at least 8 hours.  Glucose, capillary     Status: Abnormal   Collection Time: 11/07/23  4:25 PM  Result Value Ref Range   Glucose-Capillary 201 (H) 70 - 99 mg/dL    Comment: Glucose reference range applies only to samples taken after fasting for at least 8 hours.  Glucose, capillary     Status: Abnormal   Collection Time: 11/07/23  8:06 PM  Result Value Ref Range   Glucose-Capillary 151 (H) 70 - 99 mg/dL    Comment: Glucose reference range applies only to samples taken after fasting for at least 8 hours.   Comment 1 Notify RN   Glucose, capillary     Status: Abnormal   Collection Time: 11/08/23  7:43 AM  Result Value Ref Range   Glucose-Capillary 259 (H) 70 - 99 mg/dL    Comment: Glucose reference range applies only to samples taken after fasting for at least 8 hours.  Glucose, capillary     Status: Abnormal   Collection Time: 11/08/23 11:33 AM  Result Value Ref Range   Glucose-Capillary 141 (H) 70 - 99 mg/dL    Comment: Glucose reference range applies only to samples taken after fasting for at least 8 hours.  Glucose, capillary     Status: Abnormal   Collection Time: 11/08/23  4:36 PM  Result Value Ref Range   Glucose-Capillary 187 (H) 70 - 99 mg/dL    Comment: Glucose reference range applies only to samples taken after fasting for at least 8 hours.    Blood Alcohol level:  Lab Results  Component Value Date   [facility] <15 11/04/2023   ETH <10 11/21/2017    Metabolic Disorder Labs: Lab Results  Component Value Date   HGBA1C 10.3 (H) 11/04/2023   MPG 248.91  11/04/2023   No results found for: PROLACTIN No results found for: CHOL, TRIG, HDL, CHOLHDL, VLDL, LDLCALC  Physical Findings: AIMS:  , ,  ,  ,    CIWA:    COWS:      Psychiatric Specialty Exam:  Presentation  General Appearance:  Appropriate for Environment  Eye Contact: Fair  Speech: Clear and Coherent  Speech Volume: Normal    Mood and Affect  Mood: Euthymic  Affect: Appropriate   Thought Process  Thought Processes: Coherent  Descriptions of Associations:No data recorded Orientation:Full (Time, Place and Person)  Thought Content:WDL  Hallucinations:No data recorded  Ideas of Reference:None  Suicidal Thoughts:No   Homicidal Thoughts:No   Sensorium  Memory: Immediate Good; Recent Good; Remote Good  Judgment: Fair  Insight: Fair   Chartered certified accountant: Fair  Attention Span: Fair  Recall: Good  Fund of Knowledge: Good  Language: Good   Psychomotor Activity  Psychomotor Activity: Normal  Musculoskeletal: Strength & Muscle Tone: within normal limits Gait & Station: normal Assets  Assets: Social Support; Manufacturing systems engineer; Desire for Improvement    Physical Exam: Physical Exam ROS Blood pressure 112/77, pulse (!) 106, temperature 98 F (36.7 C), resp. rate 18, height 5' 9 (1.753 m), weight 65.8 kg, SpO2 97%. Body mass index is 21.41 kg/m.  Diagnosis: Principal Problem:   MDD (major depressive disorder)   PLAN: Safety and Monitoring:  -- Voluntary admission to inpatient psychiatric unit for safety, stabilization and treatment  -- Daily  contact with patient to assess and evaluate symptoms and progress in treatment  -- Patient's case to be discussed in multi-disciplinary team meeting  -- Observation Level : q15 minute checks  -- Vital signs:  q12 hours  -- Precautions: suicide, elopement, and assault -- Encouraged patient to participate in unit milieu and in scheduled group  therapies  2. Psychiatric Diagnoses and Treatment:  MDD, recurrent, severe Continue Prozac  10 mg once daily    -- The risks/benefits/side-effects/alternatives to this medication were discussed in detail with the patient and time was given for questions. The patient consents to medication trial.                -- Metabolic profile and EKG monitoring obtained while on an atypical antipsychotic (BMI: Lipid Panel: HbgA1c: QTc:)              -- Encouraged patient to participate in unit milieu and in scheduled group therapies    3. Medical Issues Being Addressed:  DM type I   4. Discharge Planning:   -- Social work and case management to assist with discharge planning and identification of hospital follow-up needs prior to discharge  -- Estimated LOS: 3-4 days  Camelia LITTIE Lukes, PA-C 11/08/2023, 6:23 PM

## 2023-11-08 NOTE — Group Note (Signed)
 Recreation Therapy Group Note   Group Topic:Healthy Support Systems  Group Date: 11/08/2023 Start Time: 1530 End Time: 1620 Facilitators: Celestia Jeoffrey FORBES ARTICE, CTRS Location: Craft Room  Group Description: Straw Bridge. In groups or individually, patients were given 10 plastic drinking straws and an equal length of masking tape. Using the materials provided, patients were instructed to build a free-standing bridge-like structure to suspend an everyday item (ex: deck of cards) off the floor or table surface. All materials were required to be used in Secondary school teacher. LRT facilitated post-activity discussion reviewing the importance of having strong and healthy support systems in our lives. LRT discussed how the people in our lives serve as the tape and the deck of cards we placed on top of our straw structure are the stressors we face in daily life. LRT and pts discussed what happens in our life when things get too heavy for us , and we don't have strong supports outside of the hospital. Pt shared 2 of their healthy supports in their life aloud in the group.   Goal Area(s) Addressed:  Patient will identify 2 healthy supports in their life. Patient will identify skills to successfully complete activity. Patient will identify correlation of this activity to life post-discharge.  Patient will build on frustration tolerance skills. Patient will increase team building and communication skills.    Affect/Mood: Appropriate   Participation Level: Active and Engaged   Participation Quality: Independent   Behavior: Appropriate, Calm, and Cooperative   Speech/Thought Process: Coherent   Insight: Good   Judgement: Good   Modes of Intervention: STEM Activity   Patient Response to Interventions:  Attentive, Engaged, Interested , and Receptive   Education Outcome:  Acknowledges education   Clinical Observations/Individualized Feedback: Jacob Burton was active in their participation of session  activities and group discussion. Pt identified my partner, Jacob Burton and my dad as healthy supports.    Plan: Continue to engage patient in RT group sessions 2-3x/week.   Jeoffrey FORBES Celestia, LRT, CTRS 11/08/2023 5:05 PM

## 2023-11-08 NOTE — Group Note (Signed)
 Recreation Therapy Group Note   Group Topic:Health and Wellness  Group Date: 11/08/2023 Start Time: 1000 End Time: 1100 Facilitators: Celestia Jeoffrey BRAVO, LRT, CTRS Location: Courtyard  Group Description: [employer]. LRT and patients played games of basketball, drew with chalk, and played corn hole while outside in the courtyard while getting fresh air and sunlight. Music was being played in the background. LRT and peers conversed about different games they have played before, what they do in their free time and anything else that is on their minds. LRT encouraged pts to drink water after being outside, sweating and getting their heart rate up.  Goal Area(s) Addressed: Patient will build on frustration tolerance skills. Patients will partake in a competitive play game with peers. Patients will gain knowledge of new leisure interest/hobby.    Affect/Mood: Appropriate   Participation Level: Active   Participation Quality: Independent   Behavior: Appropriate   Speech/Thought Process: Coherent   Insight: Good   Judgement: Good   Modes of Intervention: Activity   Patient Response to Interventions:  Receptive   Education Outcome:  Acknowledges education   Clinical Observations/Individualized Feedback: Jacob Burton was active in their participation of session activities and group discussion. Pt interacted well with LRT and peers duration of session.    Plan: Continue to engage patient in RT group sessions 2-3x/week.   Jeoffrey BRAVO Celestia, LRT, CTRS 11/08/2023 11:16 AM

## 2023-11-08 NOTE — Group Note (Signed)
 Date:  11/08/2023 Time:  8:55 PM  Group Topic/Focus:  Building Self Esteem:   The Focus of this group is helping patients become aware of the effects of self-esteem on their lives, the things they and others do that enhance or undermine their self-esteem, seeing the relationship between their level of self-esteem and the choices they make and learning ways to enhance self-esteem.    Participation Level:  Active  Participation Quality:  Appropriate, Attentive, Sharing, and Supportive  Affect:  Appropriate  Cognitive:  Appropriate  Insight: Appropriate and Good  Engagement in Group:  Engaged and Supportive  Modes of Intervention:  Discussion and Role-play  Additional Comments:     Kerri Katz 11/08/2023, 8:55 PM

## 2023-11-08 NOTE — Plan of Care (Signed)
  Problem: Education: Goal: Emotional status will improve Outcome: Progressing Goal: Mental status will improve Outcome: Progressing   Problem: Coping: Goal: Ability to verbalize frustrations and anger appropriately will improve Outcome: Progressing   

## 2023-11-08 NOTE — Plan of Care (Signed)

## 2023-11-08 NOTE — Group Note (Signed)
 Date:  11/08/2023 Time:  2:17 PM  Group Topic/Focus:  Rediscovering Joy:   The focus of this group is to explore various ways to relieve stress in a positive manner.    Participation Level:  Active  Participation Quality:  Appropriate  Affect:  Appropriate  Cognitive:  Alert  Insight: Appropriate  Engagement in Group:  Engaged  Modes of Intervention:  Activity, Discussion, and Education  Additional Comments:    Skippy LITTIE Bennett 11/08/2023, 2:17 PM

## 2023-11-08 NOTE — Inpatient Diabetes Management (Signed)
 Inpatient Diabetes Program Recommendations  AACE/ADA: New Consensus Statement on Inpatient Glycemic Control   Target Ranges:  Prepandial:   less than 140 mg/dL      Peak postprandial:   less than 180 mg/dL (1-2 hours)      Critically ill patients:  140 - 180 mg/dL    Latest Reference Range & Units 11/07/23 07:15 11/07/23 11:40 11/07/23 16:25 11/07/23 20:06  Glucose-Capillary 70 - 99 mg/dL 897 (H) 813 (H) 798 (H) 151 (H)    Latest Reference Range & Units 11/06/23 06:23 11/06/23 11:45 11/06/23 16:34 11/06/23 20:02  Glucose-Capillary 70 - 99 mg/dL 857 (H) 857 (H) 803 (H) 114 (H)   Review of Glycemic Control  Diabetes history: DM1 Outpatient Diabetes medications: Lantus  12 units BID, Novolog  1 unit per 8 grams of carbs, Novolog  for correction (1 unit drops glucose 30 mg/dl) Current orders for Inpatient glycemic control: Lantus  25 units Q24H, Novolog  0-15 units TID with meals, Novolog  0-5 units at bedtime, Novolog  6 units TID with meals   Inpatient Diabetes Program Recommendations:     Inpatient DM regimen: Agree with current insulin  orders.   Outpatient DM: At discharge, would recommend to discharge patient on same insulin  regimen as ordered while inpatient on day of discharge.  At discharge, if patient is able to get medications from [facility], please provide Rx for Basaglar  pens (#[id number]), Humalog Kwikpens [phone]), and insulin  pen needles [phone]).     NOTE: Patient admitted with intentional overdose with insulin . Patient has Type 1 DM and will require insulin . Patient has no insurance.  Inpatient diabetes coordinator spoke with patient on 11/04/23.  TOC consult for assistance with patient getting medications at discharge.    Thanks, Earnie Gainer, RN, MSN, CDCES Diabetes Coordinator Inpatient Diabetes Program [phone] (Team Pager from 8am to 5pm)

## 2023-11-08 NOTE — Group Note (Signed)
 [facility] LCSW Group Therapy Note   Group Date: 11/08/2023 Start Time: 1300 End Time: 1400  Type of Therapy/Topic:  Group Therapy:  Feelings about Diagnosis  Participation Level:  Active    Description of Group:    This group will allow patients to explore their thoughts and feelings about diagnoses they have received. Patients will be guided to explore their level of understanding and acceptance of these diagnoses. Facilitator will encourage patients to process their thoughts and feelings about the reactions of others to their diagnosis, and will guide patients in identifying ways to discuss their diagnosis with significant others in their lives. This group will be process-oriented, with patients participating in exploration of their own experiences as well as giving and receiving support and challenge from other group members.   Therapeutic Goals: 1. Patient will demonstrate understanding of diagnosis as evidence by identifying two or more symptoms of the disorder:  2. Patient will be able to express two feelings regarding the diagnosis 3. Patient will demonstrate ability to communicate their needs through discussion and/or role plays  Summary of Patient Progress: Patient was present for the entirety of group. He was actively engaged in the discussion and his comments furthered the conversation. He presented with some insight into the topic and himself. Pt appeared open and receptive to feedback/comments from both his peers and the facilitator.   Therapeutic Modalities:   Cognitive Behavioral Therapy Brief Therapy Feelings Identification    Nadara JONELLE Fam, LCSW

## 2023-11-09 LAB — GLUCOSE, CAPILLARY
Glucose-Capillary: 299 mg/dL — ABNORMAL HIGH (ref 70–99)
Glucose-Capillary: 177 mg/dL — ABNORMAL HIGH (ref 70–99)
Glucose-Capillary: 167 mg/dL — ABNORMAL HIGH (ref 70–99)
Glucose-Capillary: 139 mg/dL — ABNORMAL HIGH (ref 70–99)

## 2023-11-09 NOTE — Progress Notes (Signed)
   11/09/23 0600  Psych Admission Type (Psych Patients Only)  Admission Status Involuntary  Psychosocial Assessment  Patient Complaints None  Eye Contact Fair  Facial Expression Animated  Affect Appropriate to circumstance  Speech Logical/coherent  Interaction Assertive  Motor Activity Fidgety  Appearance/Hygiene In scrubs  Behavior Characteristics Cooperative  Mood Pleasant  Aggressive Behavior  Effect No apparent injury  Thought Process  Coherency WDL  Content WDL  Delusions None reported or observed  Perception WDL  Hallucination None reported or observed  Judgment WDL  Confusion None  Danger to Self  Current suicidal ideation? Denies  Danger to Others  Danger to Others None reported or observed

## 2023-11-09 NOTE — Group Note (Signed)
 Date:  11/09/2023 Time:  8:39 PM  Group Topic/Focus:  Wrap-Up Group:   The focus of this group is to help patients review their daily goal of treatment and discuss progress on daily workbooks.    Participation Level:  Active  Participation Quality:  Appropriate and Attentive  Affect:  Appropriate  Cognitive:  Appropriate  Insight: Appropriate and Good  Engagement in Group:  Engaged  Modes of Intervention:  Orientation  Additional Comments:    Jacob Burton 11/09/2023, 8:39 PM

## 2023-11-09 NOTE — Plan of Care (Signed)
   Problem: Education: Goal: Knowledge of [facility] Education information/materials will improve Outcome: Progressing Goal: Emotional status will improve Outcome: Progressing Goal: Mental status will improve Outcome: Progressing Goal: Verbalization of understanding the information provided will improve Outcome: Progressing

## 2023-11-09 NOTE — Group Note (Signed)
 LCSW Group Therapy Note  Group Date: 11/09/2023 Start Time: 1248 End Time: 1347   Type of Therapy and Topic:  Group Therapy: Positive Affirmations  Participation Level:  Did Not Attend   Description of Group:   This group addressed positive affirmation towards self and others.  Patients went around the room and identified two positive things about themselves and two positive things about a peer in the room.  Patients reflected on how it felt to share something positive with others, to identify positive things about themselves, and to hear positive things from others/ Patients were encouraged to have a daily reflection of positive characteristics or circumstances.   Therapeutic Goals: Patients will verbalize two of their positive qualities Patients will demonstrate empathy for others by stating two positive qualities about a peer in the group Patients will verbalize their feelings when voicing positive self affirmations and when voicing positive affirmations of others Patients will discuss the potential positive impact on their wellness/recovery of focusing on positive traits of self and others.  Summary of Patient Progress: Patient did not attend.   Therapeutic Modalities:   Cognitive Behavioral Therapy Motivational Interviewing    [name], [place] 11/09/2023  1:51 PM

## 2023-11-09 NOTE — Progress Notes (Signed)
 [facility] MD Progress Note  11/09/2023 9:56 AM Jacob Burton  MRN:  [id number]   Subjective:  Chart reviewed, case discussed in multidisciplinary meeting, patient seen during rounds.   9/24: On interview today, patient is noted to be seated in his room.  He is pleasant and cooperative.  He denies current symptoms of anxiety and depression rating both as 0 out of 10 today.  He denies SI/HI/plan and denies hallucinations.  When asked again whether he thought insulin  overdose was accidental or intentional patient replies he does not believe it was intentional but states after his blood sugar initially dropped he does not recall his frame of mind.  Patient has been attending group therapy and interacting well with peers.  He states he finds group therapies to be beneficial.  He reports tolerating current medication Prozac  10 mg once daily and denies adverse effects.  He is accepting help from social work team regarding outpatient follow-up.  Patient gave consent to contact his fiance and father regarding care plan and safe discharge planning. Consult placed to diabetes educator to advise whether there are any safer options to manage patient's type I diabetes upon discharge given concern for intentional insulin  overdose.   9/23: On interview today patient is noted to be seated on his bed.  He is calm and cooperative.  Patient denies current depressive symptoms or anxiety.  He is sleeping well and appetite is normal.  He denies SI/HI/plan and denies hallucinations.  He has been compliant with current medication regimen of Prozac  10 mg once daily and denies adverse effects.  Patient states he is looking to set up outpatient follow-up appointments on his own but will also accept help from social work as needed. He does not voice any concerns or complaints today.   9/22: On interview today patient is noted to be seated on his bed.  He is pleasant and cooperative.  Patient initially presented with worsening  depressive symptoms including lack of motivation, anhedonia and intentional overdose with insulin .  He denies depressive symptoms today and reports that overdose of insulin  was accidental.  He has been compliant with psychotropic medication, fluoxetine  10 mg once daily and is tolerating this well.  He rates depression as 0 out of 10 and anxiety as 0 out of 10 today.  He denies SI/HI/plan and denies hallucinations.  He endorses history of suicidal ideation a long time ago.  He does not voice any concerns or complaints today.  Sleep: Good  Appetite:  Good  Past Psychiatric History: see h&P Family History: History reviewed. No pertinent family history. Social History:  Social History   Substance and Sexual Activity  Alcohol Use Yes   Alcohol/week: 1.0 standard drink of alcohol   Types: 1 Cans of beer per week     Social History   Substance and Sexual Activity  Drug Use No    Social History   Socioeconomic History   Marital status: Significant Other    Spouse name: Not on file   Number of children: Not on file   Years of education: Not on file   Highest education level: Not on file  Occupational History   Not on file  Tobacco Use   Smoking status: Never   Smokeless tobacco: Never  Vaping Use   Vaping status: Never Used  Substance and Sexual Activity   Alcohol use: Yes    Alcohol/week: 1.0 standard drink of alcohol    Types: 1 Cans of beer per week   Drug use:  No   Sexual activity: Yes  Other Topics Concern   Not on file  Social History Narrative   Not on file   Social Drivers of Health   Financial Resource Strain: Low Risk  (11/01/2023)   Received from [facility]   Overall Financial Resource Strain (CARDIA)    Difficulty of Paying Living Expenses: Not very hard  Food Insecurity: No Food Insecurity (11/06/2023)   Hunger Vital Sign    Worried About Running Out of Food in the Last Year: Never true    Ran Out of Food in the Last Year: Never true   Transportation Needs: No Transportation Needs (11/06/2023)   PRAPARE - Administrator, Civil Service (Medical): No    Lack of Transportation (Non-Medical): No  Physical Activity: Not on file  Stress: Stress Concern Present (03/24/2021)   Received from [facility] - Occupational Stress Questionnaire    Feeling of Stress : To some extent  Social Connections: Not on file   Past Medical History:  Past Medical History:  Diagnosis Date   Diabetes mellitus without complication (HCC)    Insulin  Pump   History reviewed. No pertinent surgical history.  Current Medications: Current Facility-Administered Medications  Medication Dose Route Frequency Provider Last Rate Last Admin   acetaminophen  (TYLENOL ) tablet 650 mg  650 mg Oral Q6H PRN Smith, Annie B, NP       alum & mag hydroxide-simeth (MAALOX/MYLANTA) 200-200-20 MG/5ML suspension 30 mL  30 mL Oral Q4H PRN Smith, Annie B, NP       haloperidol  (HALDOL ) tablet 5 mg  5 mg Oral TID PRN Smith, Annie B, NP       And   diphenhydrAMINE  (BENADRYL ) capsule 50 mg  50 mg Oral TID PRN Smith, Annie B, NP       haloperidol  lactate (HALDOL ) injection 5 mg  5 mg Intramuscular TID PRN Smith, Annie B, NP       And   diphenhydrAMINE  (BENADRYL ) injection 50 mg  50 mg Intramuscular TID PRN Smith, Annie B, NP       And   LORazepam  (ATIVAN ) injection 2 mg  2 mg Intramuscular TID PRN Smith, Annie B, NP       haloperidol  lactate (HALDOL ) injection 10 mg  10 mg Intramuscular TID PRN Smith, Annie B, NP       And   diphenhydrAMINE  (BENADRYL ) injection 50 mg  50 mg Intramuscular TID PRN Smith, Annie B, NP       And   LORazepam  (ATIVAN ) injection 2 mg  2 mg Intramuscular TID PRN Smith, Annie B, NP       FLUoxetine  (PROZAC ) capsule 10 mg  10 mg Oral Daily Smith, Annie B, NP   10 mg at 11/09/23 0845   hydrOXYzine  (ATARAX ) tablet 10 mg  10 mg Oral TID PRN Smith, Annie B, NP       insulin  aspart  (novoLOG ) injection 0-15 Units  0-15 Units Subcutaneous TID WC Smith, Annie B, NP   8 Units at 11/09/23 0846   insulin  aspart (novoLOG ) injection 0-5 Units  0-5 Units Subcutaneous QHS Smith, Annie B, NP       insulin  aspart (novoLOG ) injection 6 Units  6 Units Subcutaneous TID WC Smith, Annie B, NP   6 Units at 11/09/23 0846   insulin  glargine (LANTUS ) injection 25 Units  25 Units Subcutaneous Q24H Smith, Annie B, NP   25 Units at 11/08/23  1421   magnesium  hydroxide (MILK OF MAGNESIA) suspension 30 mL  30 mL Oral Daily PRN Smith, Annie B, NP       traZODone  (DESYREL ) tablet 50 mg  50 mg Oral QHS PRN Smith, Annie B, NP        Lab Results:  Results for orders placed or performed during the hospital encounter of 11/06/23 (from the past 48 hours)  Glucose, capillary     Status: Abnormal   Collection Time: 11/07/23 11:40 AM  Result Value Ref Range   Glucose-Capillary 186 (H) 70 - 99 mg/dL    Comment: Glucose reference range applies only to samples taken after fasting for at least 8 hours.  Glucose, capillary     Status: Abnormal   Collection Time: 11/07/23  4:25 PM  Result Value Ref Range   Glucose-Capillary 201 (H) 70 - 99 mg/dL    Comment: Glucose reference range applies only to samples taken after fasting for at least 8 hours.  Glucose, capillary     Status: Abnormal   Collection Time: 11/07/23  8:06 PM  Result Value Ref Range   Glucose-Capillary 151 (H) 70 - 99 mg/dL    Comment: Glucose reference range applies only to samples taken after fasting for at least 8 hours.   Comment 1 Notify RN   Glucose, capillary     Status: Abnormal   Collection Time: 11/08/23  7:43 AM  Result Value Ref Range   Glucose-Capillary 259 (H) 70 - 99 mg/dL    Comment: Glucose reference range applies only to samples taken after fasting for at least 8 hours.  Glucose, capillary     Status: Abnormal   Collection Time: 11/08/23 11:33 AM  Result Value Ref Range   Glucose-Capillary 141 (H) 70 - 99 mg/dL     Comment: Glucose reference range applies only to samples taken after fasting for at least 8 hours.  Glucose, capillary     Status: Abnormal   Collection Time: 11/08/23  4:36 PM  Result Value Ref Range   Glucose-Capillary 187 (H) 70 - 99 mg/dL    Comment: Glucose reference range applies only to samples taken after fasting for at least 8 hours.  Glucose, capillary     Status: Abnormal   Collection Time: 11/08/23  8:21 PM  Result Value Ref Range   Glucose-Capillary 185 (H) 70 - 99 mg/dL    Comment: Glucose reference range applies only to samples taken after fasting for at least 8 hours.  Glucose, capillary     Status: Abnormal   Collection Time: 11/09/23  7:32 AM  Result Value Ref Range   Glucose-Capillary 299 (H) 70 - 99 mg/dL    Comment: Glucose reference range applies only to samples taken after fasting for at least 8 hours.    Blood Alcohol level:  Lab Results  Component Value Date   [facility] <15 11/04/2023   ETH <10 11/21/2017    Metabolic Disorder Labs: Lab Results  Component Value Date   HGBA1C 10.3 (H) 11/04/2023   MPG 248.91 11/04/2023   No results found for: PROLACTIN No results found for: CHOL, TRIG, HDL, CHOLHDL, VLDL, LDLCALC  Physical Findings: AIMS:  , ,  ,  ,    CIWA:    COWS:      Psychiatric Specialty Exam:  Presentation  General Appearance:  Appropriate for Environment  Eye Contact: Fair  Speech: Clear and Coherent  Speech Volume: Normal    Mood and Affect  Mood: Euthymic  Affect: Appropriate  Thought Process  Thought Processes: Coherent  Descriptions of Associations:No data recorded Orientation:Full (Time, Place and Person)  Thought Content:WDL  Hallucinations: none  Ideas of Reference:None  Suicidal Thoughts:No   Homicidal Thoughts:No   Sensorium  Memory: Immediate Good; Recent Good; Remote Good  Judgment: Fair  Insight: Fair   Chartered certified accountant: Fair  Attention  Span: Fair  Recall: Good  Fund of Knowledge: Good  Language: Good   Psychomotor Activity  Psychomotor Activity: Normal  Musculoskeletal: Strength & Muscle Tone: within normal limits Gait & Station: normal Assets  Assets: Social Support; Manufacturing systems engineer; Desire for Improvement    Physical Exam: Physical Exam ROS Blood pressure 118/71, pulse 96, temperature (!) 97.4 F (36.3 C), resp. rate 16, height 5' 9 (1.753 m), weight 65.8 kg, SpO2 99%. Body mass index is 21.41 kg/m.  Diagnosis: Principal Problem:   MDD (major depressive disorder)   PLAN: Safety and Monitoring:  -- Voluntary admission to inpatient psychiatric unit for safety, stabilization and treatment  -- Daily contact with patient to assess and evaluate symptoms and progress in treatment  -- Patient's case to be discussed in multi-disciplinary team meeting  -- Observation Level : q15 minute checks  -- Vital signs:  q12 hours  -- Precautions: suicide, elopement, and assault -- Encouraged patient to participate in unit milieu and in scheduled group therapies   2. Psychiatric Diagnoses and Treatment:  MDD, recurrent, severe Continue Prozac  10 mg once daily    -- The risks/benefits/side-effects/alternatives to this medication were discussed in detail with the patient and time was given for questions. The patient consents to medication trial.                -- Metabolic profile and EKG monitoring obtained while on an atypical antipsychotic (BMI: Lipid Panel: HbgA1c: QTc:)              -- Encouraged patient to participate in unit milieu and in scheduled group therapies    3. Medical Issues Being Addressed:  DM type I Consult placed to diabetes educator to discuss possible alternatives to insulin  due to concern for intentional insulin  overdose  4. Discharge Planning:   -- Social work and case management to assist with discharge planning and identification of hospital follow-up needs prior to  discharge  -- Estimated LOS: 3-4 days  Camelia LITTIE Lukes, PA-C 11/09/2023, 9:56 AM

## 2023-11-09 NOTE — Progress Notes (Signed)
   11/09/23 0845  Psych Admission Type (Psych Patients Only)  Admission Status Involuntary  Psychosocial Assessment  Patient Complaints None  Eye Contact Fair  Facial Expression Animated  Affect Appropriate to circumstance  Speech Logical/coherent  Interaction Assertive  Motor Activity Other (Comment) (WDL)  Appearance/Hygiene In scrubs  Behavior Characteristics Cooperative;Appropriate to situation  Mood Pleasant  Thought Process  Coherency WDL  Content WDL  Delusions None reported or observed  Perception WDL  Hallucination None reported or observed  Judgment Poor  Confusion None  Danger to Self  Current suicidal ideation? Denies  Agreement Not to Harm Self Yes  Description of Agreement Verbal  Danger to Others  Danger to Others None reported or observed

## 2023-11-09 NOTE — Plan of Care (Signed)
  Problem: Coping: Goal: Coping ability will improve Outcome: Progressing   Problem: Health Behavior/Discharge Planning: Goal: Identification of resources available to assist in meeting health care needs will improve Outcome: Progressing   Problem: Medication: Goal: Compliance with prescribed medication regimen will improve Outcome: Progressing   

## 2023-11-09 NOTE — Group Note (Signed)
 Date:  11/09/2023 Time:  5:04 PM  Group Topic/Focus:  Activity Group: The focus of the group is to promote activity and encourage patients to go outside to the courtyard to get some fresh air and some exercise.    Participation Level:  Active  Participation Quality:  Appropriate  Affect:  Appropriate  Cognitive:  Appropriate  Insight: Appropriate  Engagement in Group:  Engaged  Modes of Intervention:  Activity  Additional Comments:    Camellia HERO Oprah Camarena 11/09/2023, 5:04 PM

## 2023-11-09 NOTE — Group Note (Signed)
 Date:  11/09/2023 Time:  4:59 PM  Group Topic/Focus:  Personal Choices and Values:   The focus of this group is to help patients assess and explore the importance of values in their lives, how their values affect their decisions, how they express their values and what opposes their expression.    Participation Level:  Did Not Attend   Camellia HERO Addie Cederberg 11/09/2023, 4:59 PM

## 2023-11-10 LAB — GLUCOSE, CAPILLARY
Glucose-Capillary: 113 mg/dL — ABNORMAL HIGH (ref 70–99)
Glucose-Capillary: 112 mg/dL — ABNORMAL HIGH (ref 70–99)
Glucose-Capillary: 143 mg/dL — ABNORMAL HIGH (ref 70–99)
Glucose-Capillary: 76 mg/dL (ref 70–99)

## 2023-11-10 NOTE — Group Note (Signed)
 Recreation Therapy Group Note   Group Topic:General Recreation  Group Date: 11/10/2023 Start Time: 1000 End Time: 1100 Facilitators: Celestia Jeoffrey BRAVO, LRT, CTRS Location: Courtyard  Group Description: [employer]. LRT and patients played games of basketball, drew with chalk, and played corn hole while outside in the courtyard while getting fresh air and sunlight. Music was being played in the background. LRT and peers conversed about different games they have played before, what they do in their free time and anything else that is on their minds. LRT encouraged pts to drink water after being outside, sweating and getting their heart rate up.  Goal Area(s) Addressed: Patient will build on frustration tolerance skills. Patients will partake in a competitive play game with peers. Patients will gain knowledge of new leisure interest/hobby.    Affect/Mood: Appropriate   Participation Level: Active   Participation Quality: Independent   Behavior: Appropriate   Speech/Thought Process: Coherent   Insight: Good   Judgement: Good   Modes of Intervention: Activity   Patient Response to Interventions:  Receptive   Education Outcome:  Acknowledges education   Clinical Observations/Individualized Feedback: Jacob Burton was active in their participation of session activities and group discussion. Pt interacted well with LRT and peers duration of session.    Plan: Continue to engage patient in RT group sessions 2-3x/week.   Jeoffrey BRAVO Celestia, LRT, CTRS 11/10/2023 11:33 AM

## 2023-11-10 NOTE — Progress Notes (Signed)
   11/10/23 1300  Psych Admission Type (Psych Patients Only)  Admission Status Involuntary  Psychosocial Assessment  Patient Complaints None  Eye Contact Fair  Facial Expression Animated  Affect Appropriate to circumstance  Speech Logical/coherent  Interaction Assertive  Motor Activity Other (Comment) (appropriate to developmental age)  Appearance/Hygiene Unremarkable  Behavior Characteristics Cooperative;Appropriate to situation  Mood Pleasant  Thought Process  Coherency WDL  Content WDL  Delusions None reported or observed  Perception WDL  Hallucination None reported or observed  Judgment WDL  Confusion None  Danger to Self  Current suicidal ideation? Denies  Self-Injurious Behavior No self-injurious ideation or behavior indicators observed or expressed   Danger to Others  Danger to Others None reported or observed

## 2023-11-10 NOTE — Progress Notes (Signed)
 [facility] MD Progress Note  11/10/2023 4:54 PM Jacob Burton  MRN:  [id number]   Subjective:  Chart reviewed, case discussed in multidisciplinary meeting, patient seen during rounds.   9/25: On interview today patient is noted to be sitting in his room.  He is calm and cooperative.  He denies current symptoms of depression or anxiety.  He denies SI/HI/plan and denies hallucinations.  He is sleeping well and states appetite is normal.  He is tolerating current medication regimen well without adverse effects.  He has been able to speak with his father and his girlfriend on the phone during his stay.  Per nursing report, patient has been compliant with medications and participating in group therapies.  Patient states he previously used a insulin  pump to manage type 1 diabetes but states this is currently cost prohibitive.  Patient is working with provider, diabetes coordinator, and social work team for safe discharge planning.  Team is discussing ways to help minimize risk of future insulin  overdose.  9/24: On interview today, patient is noted to be seated in his room.  He is pleasant and cooperative.  He denies current symptoms of anxiety and depression rating both as 0 out of 10 today.  He denies SI/HI/plan and denies hallucinations.  When asked again whether he thought insulin  overdose was accidental or intentional patient replies he does not believe it was intentional but states after his blood sugar initially dropped he does not recall his frame of mind.  Patient has been attending group therapy and interacting well with peers.  He states he finds group therapies to be beneficial.  He reports tolerating current medication Prozac  10 mg once daily and denies adverse effects.  He is accepting help from social work team regarding outpatient follow-up.  Patient gave consent to contact his fiance and father regarding care plan and safe discharge planning. Consult placed to diabetes educator to advise whether  there are any safer options to manage patient's type I diabetes upon discharge given concern for intentional insulin  overdose.   9/23: On interview today patient is noted to be seated on his bed.  He is calm and cooperative.  Patient denies current depressive symptoms or anxiety.  He is sleeping well and appetite is normal.  He denies SI/HI/plan and denies hallucinations.  He has been compliant with current medication regimen of Prozac  10 mg once daily and denies adverse effects.  Patient states he is looking to set up outpatient follow-up appointments on his own but will also accept help from social work as needed. He does not voice any concerns or complaints today.   9/22: On interview today patient is noted to be seated on his bed.  He is pleasant and cooperative.  Patient initially presented with worsening depressive symptoms including lack of motivation, anhedonia and intentional overdose with insulin .  He denies depressive symptoms today and reports that overdose of insulin  was accidental.  He has been compliant with psychotropic medication, fluoxetine  10 mg once daily and is tolerating this well.  He rates depression as 0 out of 10 and anxiety as 0 out of 10 today.  He denies SI/HI/plan and denies hallucinations.  He endorses history of suicidal ideation a long time ago.  He does not voice any concerns or complaints today.  Sleep: Good  Appetite:  Good  Past Psychiatric History: see h&P Family History: History reviewed. No pertinent family history. Social History:  Social History   Substance and Sexual Activity  Alcohol Use Yes  Alcohol/week: 1.0 standard drink of alcohol   Types: 1 Cans of beer per week     Social History   Substance and Sexual Activity  Drug Use No    Social History   Socioeconomic History   Marital status: Significant Other    Spouse name: Not on file   Number of children: Not on file   Years of education: Not on file   Highest education level: Not on  file  Occupational History   Not on file  Tobacco Use   Smoking status: Never   Smokeless tobacco: Never  Vaping Use   Vaping status: Never Used  Substance and Sexual Activity   Alcohol use: Yes    Alcohol/week: 1.0 standard drink of alcohol    Types: 1 Cans of beer per week   Drug use: No   Sexual activity: Yes  Other Topics Concern   Not on file  Social History Narrative   Not on file   Social Drivers of Health   Financial Resource Strain: Low Risk  (11/01/2023)   Received from [facility]   Overall Financial Resource Strain (CARDIA)    Difficulty of Paying Living Expenses: Not very hard  Food Insecurity: No Food Insecurity (11/06/2023)   Hunger Vital Sign    Worried About Running Out of Food in the Last Year: Never true    Ran Out of Food in the Last Year: Never true  Transportation Needs: No Transportation Needs (11/06/2023)   PRAPARE - Administrator, Civil Service (Medical): No    Lack of Transportation (Non-Medical): No  Physical Activity: Not on file  Stress: Stress Concern Present (03/24/2021)   Received from [facility] - Occupational Stress Questionnaire    Feeling of Stress : To some extent  Social Connections: Not on file   Past Medical History:  Past Medical History:  Diagnosis Date   Diabetes mellitus without complication (HCC)    Insulin  Pump   History reviewed. No pertinent surgical history.  Current Medications: Current Facility-Administered Medications  Medication Dose Route Frequency Provider Last Rate Last Admin   acetaminophen  (TYLENOL ) tablet 650 mg  650 mg Oral Q6H PRN Smith, Annie B, NP       alum & mag hydroxide-simeth (MAALOX/MYLANTA) 200-200-20 MG/5ML suspension 30 mL  30 mL Oral Q4H PRN Smith, Annie B, NP       haloperidol  (HALDOL ) tablet 5 mg  5 mg Oral TID PRN Smith, Annie B, NP       And   diphenhydrAMINE  (BENADRYL ) capsule 50 mg  50 mg Oral TID  PRN Smith, Annie B, NP       haloperidol  lactate (HALDOL ) injection 5 mg  5 mg Intramuscular TID PRN Smith, Annie B, NP       And   diphenhydrAMINE  (BENADRYL ) injection 50 mg  50 mg Intramuscular TID PRN Smith, Annie B, NP       And   LORazepam  (ATIVAN ) injection 2 mg  2 mg Intramuscular TID PRN Smith, Annie B, NP       haloperidol  lactate (HALDOL ) injection 10 mg  10 mg Intramuscular TID PRN Smith, Annie B, NP       And   diphenhydrAMINE  (BENADRYL ) injection 50 mg  50 mg Intramuscular TID PRN Smith, Annie B, NP       And   LORazepam  (ATIVAN ) injection 2 mg  2 mg Intramuscular TID PRN Smith, Annie B, NP  FLUoxetine  (PROZAC ) capsule 10 mg  10 mg Oral Daily Smith, Annie B, NP   10 mg at 11/10/23 0840   hydrOXYzine  (ATARAX ) tablet 10 mg  10 mg Oral TID PRN Smith, Annie B, NP       insulin  aspart (novoLOG ) injection 0-15 Units  0-15 Units Subcutaneous TID WC Smith, Annie B, NP   3 Units at 11/09/23 1729   insulin  aspart (novoLOG ) injection 0-5 Units  0-5 Units Subcutaneous QHS Smith, Annie B, NP       insulin  aspart (novoLOG ) injection 6 Units  6 Units Subcutaneous TID WC Smith, Annie B, NP   6 Units at 11/10/23 1240   insulin  glargine (LANTUS ) injection 25 Units  25 Units Subcutaneous Q24H Smith, Annie B, NP   25 Units at 11/10/23 1539   magnesium  hydroxide (MILK OF MAGNESIA) suspension 30 mL  30 mL Oral Daily PRN Smith, Annie B, NP       traZODone  (DESYREL ) tablet 50 mg  50 mg Oral QHS PRN Smith, Annie B, NP        Lab Results:  Results for orders placed or performed during the hospital encounter of 11/06/23 (from the past 48 hours)  Glucose, capillary     Status: Abnormal   Collection Time: 11/08/23  8:21 PM  Result Value Ref Range   Glucose-Capillary 185 (H) 70 - 99 mg/dL    Comment: Glucose reference range applies only to samples taken after fasting for at least 8 hours.  Glucose, capillary     Status: Abnormal   Collection Time: 11/09/23  7:32 AM  Result Value Ref Range    Glucose-Capillary 299 (H) 70 - 99 mg/dL    Comment: Glucose reference range applies only to samples taken after fasting for at least 8 hours.  Glucose, capillary     Status: Abnormal   Collection Time: 11/09/23 11:00 AM  Result Value Ref Range   Glucose-Capillary 177 (H) 70 - 99 mg/dL    Comment: Glucose reference range applies only to samples taken after fasting for at least 8 hours.  Glucose, capillary     Status: Abnormal   Collection Time: 11/09/23  4:26 PM  Result Value Ref Range   Glucose-Capillary 167 (H) 70 - 99 mg/dL    Comment: Glucose reference range applies only to samples taken after fasting for at least 8 hours.  Glucose, capillary     Status: Abnormal   Collection Time: 11/09/23  8:02 PM  Result Value Ref Range   Glucose-Capillary 139 (H) 70 - 99 mg/dL    Comment: Glucose reference range applies only to samples taken after fasting for at least 8 hours.  Glucose, capillary     Status: Abnormal   Collection Time: 11/10/23  7:44 AM  Result Value Ref Range   Glucose-Capillary 113 (H) 70 - 99 mg/dL    Comment: Glucose reference range applies only to samples taken after fasting for at least 8 hours.  Glucose, capillary     Status: Abnormal   Collection Time: 11/10/23 11:29 AM  Result Value Ref Range   Glucose-Capillary 112 (H) 70 - 99 mg/dL    Comment: Glucose reference range applies only to samples taken after fasting for at least 8 hours.   Comment 1 Notify RN   Glucose, capillary     Status: Abnormal   Collection Time: 11/10/23  4:22 PM  Result Value Ref Range   Glucose-Capillary 143 (H) 70 - 99 mg/dL    Comment: Glucose reference range applies  only to samples taken after fasting for at least 8 hours.    Blood Alcohol level:  Lab Results  Component Value Date   [facility] <15 11/04/2023   ETH <10 11/21/2017    Metabolic Disorder Labs: Lab Results  Component Value Date   HGBA1C 10.3 (H) 11/04/2023   MPG 248.91 11/04/2023   No results found for: PROLACTIN No  results found for: CHOL, TRIG, HDL, CHOLHDL, VLDL, LDLCALC  Physical Findings: AIMS:  , ,  ,  ,    CIWA:    COWS:      Psychiatric Specialty Exam:  Presentation  General Appearance:  Appropriate for Environment  Eye Contact: Fair  Speech: Clear and Coherent  Speech Volume: Normal    Mood and Affect  Mood: Euthymic  Affect: Appropriate   Thought Process  Thought Processes: Coherent  Descriptions of Associations: Intact Orientation:Full (Time, Place and Person)  Thought Content:WDL  Hallucinations: none  Ideas of Reference:None  Suicidal Thoughts:No   Homicidal Thoughts:No   Sensorium  Memory: Immediate Good; Recent Good; Remote Good  Judgment: Fair  Insight: Fair   Chartered certified accountant: Fair  Attention Span: Fair  Recall: Good  Fund of Knowledge: Good  Language: Good   Psychomotor Activity  Psychomotor Activity: Normal  Musculoskeletal: Strength & Muscle Tone: within normal limits Gait & Station: normal Assets  Assets: Social Support; Manufacturing systems engineer; Desire for Improvement    Physical Exam: Physical Exam ROS Blood pressure 114/76, pulse (!) 102, temperature (!) 97.2 F (36.2 C), resp. rate 16, height 5' 9 (1.753 m), weight 65.8 kg, SpO2 97%. Body mass index is 21.41 kg/m.  Diagnosis: Principal Problem:   MDD (major depressive disorder)   PLAN: Safety and Monitoring:  -- Voluntary admission to inpatient psychiatric unit for safety, stabilization and treatment  -- Daily contact with patient to assess and evaluate symptoms and progress in treatment  -- Patient's case to be discussed in multi-disciplinary team meeting  -- Observation Level : q15 minute checks  -- Vital signs:  q12 hours  -- Precautions: suicide, elopement, and assault -- Encouraged patient to participate in unit milieu and in scheduled group therapies   2. Psychiatric Diagnoses and Treatment:  MDD, recurrent,  severe Continue Prozac  10 mg once daily    -- The risks/benefits/side-effects/alternatives to this medication were discussed in detail with the patient and time was given for questions. The patient consents to medication trial.                -- Metabolic profile and EKG monitoring obtained while on an atypical antipsychotic (BMI: Lipid Panel: HbgA1c: QTc:)              -- Encouraged patient to participate in unit milieu and in scheduled group therapies    3. Medical Issues Being Addressed:  DM type I Diabetes coordinator helping to manage and advise on safe discharge planning  4. Discharge Planning:   -- Social work and case management to assist with discharge planning and identification of hospital follow-up needs prior to discharge  -- Estimated LOS: 3-4 days  Camelia LITTIE Lukes, PA-C 11/10/2023, 4:54 PM

## 2023-11-10 NOTE — Group Note (Signed)
 Date:  11/10/2023 Time:  6:47 PM  Group Topic/Focus:  Wellness Toolbox:   The focus of this group is to discuss various aspects of wellness, balancing those aspects and exploring ways to increase the ability to experience wellness.  Patients will create a wellness toolbox for use upon discharge.    Participation Level:  Active  Participation Quality:  Appropriate  Affect:  Appropriate  Cognitive:  Appropriate  Insight: Appropriate  Engagement in Group:  Engaged  Modes of Intervention:  Activity and Socialization  Additional Comments:    Deitra Caron Mainland 11/10/2023, 6:47 PM

## 2023-11-10 NOTE — Progress Notes (Signed)
   11/09/23 2241  Psych Admission Type (Psych Patients Only)  Admission Status Involuntary  Psychosocial Assessment  Patient Complaints None  Eye Contact Fair  Facial Expression Animated  Affect Appropriate to circumstance  Speech Logical/coherent  Interaction Assertive  Motor Activity Fidgety  Appearance/Hygiene In scrubs  Behavior Characteristics Cooperative;Appropriate to situation  Mood Pleasant  Aggressive Behavior  Effect No apparent injury  Thought Process  Coherency WDL  Content WDL  Delusions None reported or observed  Perception WDL  Hallucination None reported or observed  Judgment WDL  Confusion None  Danger to Self  Current suicidal ideation? Denies  Agreement Not to Harm Self Yes  Danger to Others  Danger to Others None reported or observed

## 2023-11-10 NOTE — Group Note (Signed)
 LCSW Group Therapy Note  Group Date: 11/10/2023 Start Time: 1315 End Time: 1400   Type of Therapy and Topic:  Group Therapy: Anger Cues and Responses  Participation Level:  Active   Description of Group:   In this group, patients learned how to recognize the physical, cognitive, emotional, and behavioral responses they have to anger-provoking situations.  They identified a recent time they became angry and how they reacted.  They analyzed how their reaction was possibly beneficial and how it was possibly unhelpful.  The group discussed a variety of healthier coping skills that could help with such a situation in the future.  Focus was placed on how helpful it is to recognize the underlying emotions to our anger, because working on those can lead to a more permanent solution as well as our ability to focus on the important rather than the urgent.  Therapeutic Goals: Patients will remember their last incident of anger and how they felt emotionally and physically, what their thoughts were at the time, and how they behaved. Patients will identify how their behavior at that time worked for them, as well as how it worked against them. Patients will explore possible new behaviors to use in future anger situations. Patients will learn that anger itself is normal and cannot be eliminated, and that healthier reactions can assist with resolving conflict rather than worsening situations.  Summary of Patient Progress:   He was active during the group. He shared a recent occurrence wherein feeling hurt led to anger. He demonstrated fair insight into the subject matter, was respectful of peers, and participated throughout the entire session.  Therapeutic Modalities:   Cognitive Behavioral Therapy    Sherryle JINNY Margo, LCSW 11/10/2023  2:52 PM

## 2023-11-10 NOTE — [facility] Group Notes (Signed)
 Spirituality Group   Group Goal: Support / Education around grief and loss    Group Description: Following introductions and group rules, group members engaged in facilitated group dialog and support around topic of loss, with particular support around experiences of loss in their lives. Group members identified types of loss (relationships / self / things) as well as patterns, circumstances, and changes that precipitate loss. Reflection invited on thoughts / feelings around loss, normalized grief responses, and recognized variety in grief experience. Group noted Worden's four tasks of grief in discussion. Group drew on Adlerian / Rogerian, narrative, MI, with Yalom's group therapy as a primary framework.   Observations: Jacob Burton was actively engaged in the group discussion. He processed meaningfully with peers, helped fostered trust and mutual empathy.  Jacob Burton L. [name].Div

## 2023-11-10 NOTE — Inpatient Diabetes Management (Signed)
 Inpatient Diabetes Program Recommendations  AACE/ADA: New Consensus Statement on Inpatient Glycemic Control   Target Ranges:  Prepandial:   less than 140 mg/dL      Peak postprandial:   less than 180 mg/dL (1-2 hours)      Critically ill patients:  140 - 180 mg/dL    Latest Reference Range & Units 11/09/23 07:32 11/09/23 11:00 11/09/23 16:26 11/09/23 20:02  Glucose-Capillary 70 - 99 mg/dL 700 (H) 822 (H) 832 (H) 139 (H)    Latest Reference Range & Units 11/08/23 07:43 11/08/23 11:33 11/08/23 16:36 11/08/23 20:21  Glucose-Capillary 70 - 99 mg/dL 740 (H) 858 (H) 812 (H) 185 (H)   Review of Glycemic Control  Diabetes history: DM1 Outpatient Diabetes medications: Lantus  12 units BID, Novolog  1 unit per 8 grams of carbs, Novolog  for correction (1 unit drops glucose 30 mg/dl) Current orders for Inpatient glycemic control: Lantus  25 units Q24H, Novolog  0-15 units TID with meals, Novolog  0-5 units at bedtime, Novolog  6 units TID with meals   Inpatient Diabetes Program Recommendations:    Outpatient DM: At discharge patient will require insulin .  Patient has Type 1 DM and makes NO insulin  at all and oral DM medications would not work for patient.  Even with intentional insulin  overdose, patient will have to discharge on insulin  as it is the only treatment for Type 1 DM.  Recommend to discharge patient on same insulin  regimen as ordered while inpatient on day of discharge.  At discharge, if patient is able to get medications from [facility], please provide Rx for Basaglar  pens (#[id number]), Humalog Kwikpens [phone]), and insulin  pen needles [phone]).    NOTE: Patient admitted with intentional overdose with insulin . Patient has Type 1 DM and will require insulin . Patient has no insurance.  Inpatient diabetes coordinator spoke with patient on 11/04/23.  TOC consult for assistance with patient getting medications at discharge.   Thanks, Earnie Gainer, RN, MSN, CDCES Diabetes  Coordinator Inpatient Diabetes Program [phone] (Team Pager from 8am to 5pm)

## 2023-11-10 NOTE — Plan of Care (Signed)
  Problem: Activity: Goal: Interest or engagement in leisure activities will improve Outcome: Progressing   Problem: Health Behavior/Discharge Planning: Goal: Compliance with therapeutic regimen will improve Outcome: Progressing   Problem: Role Relationship: Goal: Will demonstrate positive changes in social behaviors and relationships Outcome: Progressing

## 2023-11-10 NOTE — Group Note (Signed)
 Recreation Therapy Group Note   Group Topic:Relaxation  Group Date: 11/10/2023 Start Time: 1530 End Time: 1610 Facilitators: Celestia Jeoffrey BRAVO, LRT, CTRS Location: Dayroom  Group Description: PMR (Progressive Muscle Relaxation). LRT educates patients on what PMR is and the benefits that come from it. Patients are asked to sit with their feet flat on the floor while sitting up and all the way back in their chair, if possible. LRT and pts follow a prompt through a speaker that requires you to tense and release different muscles in their body and focus on their breathing. During session, lights are off and soft music is being played. Pts are given a stress ball to use if needed.   Goal Area(s) Addressed:  Patients will be able to describe progressive muscle relaxation.  Patient will practice using relaxation technique. Patient will identify a new coping skill.  Patient will follow multistep directions to reduce anxiety and stress.    Affect/Mood: Appropriate   Participation Level: Active and Engaged   Participation Quality: Independent   Behavior: Appropriate, Calm, and Cooperative   Speech/Thought Process: Coherent   Insight: Good   Judgement: Good   Modes of Intervention: Education and Exploration   Patient Response to Interventions:  Attentive, Engaged, Interested , and Receptive   Education Outcome:  Acknowledges education   Clinical Observations/Individualized Feedback: [place] was active in their participation of session activities and group discussion. Pt completed all exercises as prompted.    Plan: Continue to engage patient in RT group sessions 2-3x/week.   [address] LRT, CTRS 11/10/2023 5:18 PM

## 2023-11-10 NOTE — Plan of Care (Signed)

## 2023-11-11 DIAGNOSIS — F332 Major depressive disorder, recurrent severe without psychotic features: Principal | ICD-10-CM

## 2023-11-11 LAB — GLUCOSE, CAPILLARY
Glucose-Capillary: 88 mg/dL (ref 70–99)
Glucose-Capillary: 91 mg/dL (ref 70–99)
Glucose-Capillary: 142 mg/dL — ABNORMAL HIGH (ref 70–99)

## 2023-11-11 MED ORDER — INSULIN GLARGINE 100 UNIT/ML ~~LOC~~ SOLN
23.0000 [IU] | SUBCUTANEOUS | Status: DC
  Administered 2023-11-11 – 2023-11-14 (×4): 23 [IU] via SUBCUTANEOUS
  Filled 2023-11-11 (×5): qty 0.23

## 2023-11-11 MED ORDER — INSULIN ASPART 100 UNIT/ML IJ SOLN
5.0000 [IU] | Freq: Three times a day (TID) | INTRAMUSCULAR | Status: DC
  Administered 2023-11-11 – 2023-11-15 (×12): 5 [IU] via SUBCUTANEOUS
  Filled 2023-11-11 (×12): qty 1

## 2023-11-11 NOTE — Group Note (Signed)
 Date:  11/11/2023 Time:  6:05 PM  Group Topic/Focus:  Wellness Toolbox:   The focus of this group is to discuss various aspects of wellness, balancing those aspects and exploring ways to increase the ability to experience wellness.  Patients will create a wellness toolbox for use upon discharge.    Participation Level:  Active  Participation Quality:  Appropriate  Affect:  Appropriate  Cognitive:  Appropriate  Insight: Appropriate  Engagement in Group:  Engaged  Modes of Intervention:  Activity and Socialization  Additional Comments:    Jacob Burton 11/11/2023, 6:05 PM

## 2023-11-11 NOTE — Group Note (Signed)
 Date:  11/11/2023 Time:  10:55 PM  Group Topic/Focus:  Making Healthy Choices:   The focus of this group is to help patients identify negative/unhealthy choices they were using prior to admission and identify positive/healthier coping strategies to replace them upon discharge.    Participation Level:  Active  Participation Quality:  Appropriate  Affect:  Appropriate  Cognitive:  Appropriate  Insight: Appropriate  Engagement in Group:  Engaged  Modes of Intervention:  Discussion and Education  Additional Comments:    Jacob Burton 11/11/2023, 10:55 PM

## 2023-11-11 NOTE — Inpatient Diabetes Management (Signed)
 Inpatient Diabetes Program Recommendations  AACE/ADA: New Consensus Statement on Inpatient Glycemic Control   Target Ranges:  Prepandial:   less than 140 mg/dL      Peak postprandial:   less than 180 mg/dL (1-2 hours)      Critically ill patients:  140 - 180 mg/dL    Latest Reference Range & Units 11/10/23 07:44 11/10/23 11:29 11/10/23 16:22 11/10/23 20:02 11/11/23 07:29  Glucose-Capillary 70 - 99 mg/dL 886 (H) 887 (H) 856 (H) 76 88    Review of Glycemic Control  Diabetes history: DM1 Outpatient Diabetes medications: Lantus  12 units BID, Novolog  1 unit per 8 grams of carbs, Novolog  for correction (1 unit drops glucose 30 mg/dl) Current orders for Inpatient glycemic control: Lantus  25 units Q24H, Novolog  0-15 units TID with meals, Novolog  0-5 units at bedtime, Novolog  6 units TID with meals   Inpatient Diabetes Program Recommendations:     Insulin : CBG 88 mg/dl this morning. Please consider decreasing Lantus  to 23 units Q24H and decrease meal coverage to Novolog  5 units TID with meal.  Outpatient DM: At discharge patient will require insulin .  Patient has Type 1 DM and makes NO insulin  at all and oral DM medications would not work for patient.  Even with intentional insulin  overdose, patient will have to discharge on insulin  as it is the only treatment for Type 1 DM.  Recommend to discharge patient on same insulin  regimen as ordered while inpatient on day of discharge.  At discharge, if patient is able to get medications from [facility], please provide Rx for Basaglar  pens (#[id number]), Humalog Kwikpens [phone]), and insulin  pen needles [phone]).    NOTE: Patient admitted with intentional overdose with insulin . Patient has Type 1 DM and will require insulin . Patient has no insurance.  Inpatient diabetes coordinator spoke with patient on 11/04/23.  TOC consult for assistance with patient getting medications at discharge.    Thanks, Earnie Gainer, RN, MSN, CDCES Diabetes  Coordinator Inpatient Diabetes Program [phone] (Team Pager from 8am to 5pm)

## 2023-11-11 NOTE — Group Note (Signed)
 Date:  11/11/2023 Time:  10:48 PM  Group Topic/Focus:  Emotional Education:   The focus of this group is to discuss what feelings/emotions are, and how they are experienced.    Participation Level:  Active  Participation Quality:  Appropriate  Affect:  Appropriate  Cognitive:  Appropriate  Insight: Appropriate  Engagement in Group:  Engaged  Modes of Intervention:  Education  Additional Comments:    Navjot Pilgrim L 11/11/2023, 10:48 PM

## 2023-11-11 NOTE — Progress Notes (Signed)
 [facility] MD Progress Note  11/11/2023 1:13 PM Jacob Burton  MRN:  [id number]   Subjective:  Chart reviewed, case discussed in multidisciplinary meeting, patient seen during rounds.   9/26: On interview today patient is noted to be seated seated in his room.  He is calm and cooperative.  He denies current symptoms of anxiety or depression.  He denies SI/HI/plan and denies hallucinations.  He is future oriented stating he is looking forward to seeing his son upon discharge.  Diabetes educator recommended changes to insulin  regimen which have been ordered.  Diabetes educator also recommended insulin  pens as safer option considering patient's recent overdose on insulin .  She states patient will require insulin  to keep DM controlled. She further advises that insulin  pump also carries risk as he could overdose on an insulin  pump as well. TOC is involved in case to assist with getting the Basaglar  and Humalog pens, and pen needles from [facility]. Hospital financial counselors also involved in case. Medicaid referral has been placed.  Patient is established with an endocrinologist at [facility] clinic.   9/25: On interview today patient is noted to be sitting in his room.  He is calm and cooperative.  He denies current symptoms of depression or anxiety.  He denies SI/HI/plan and denies hallucinations.  He is sleeping well and states appetite is normal.  He is tolerating current medication regimen well without adverse effects.  He has been able to speak with his father and his girlfriend on the phone during his stay.  Per nursing report, patient has been compliant with medications and participating in group therapies.  Patient states he previously used a insulin  pump to manage type 1 diabetes but states this is currently cost prohibitive.  Patient is working with provider, diabetes coordinator, and social work team for safe discharge planning.  Team is discussing ways to help minimize risk of  future insulin  overdose.  9/24: On interview today, patient is noted to be seated in his room.  He is pleasant and cooperative.  He denies current symptoms of anxiety and depression rating both as 0 out of 10 today.  He denies SI/HI/plan and denies hallucinations.  When asked again whether he thought insulin  overdose was accidental or intentional patient replies he does not believe it was intentional but states after his blood sugar initially dropped he does not recall his frame of mind.  Patient has been attending group therapy and interacting well with peers.  He states he finds group therapies to be beneficial.  He reports tolerating current medication Prozac  10 mg once daily and denies adverse effects.  He is accepting help from social work team regarding outpatient follow-up.  Patient gave consent to contact his fiance and father regarding care plan and safe discharge planning. Consult placed to diabetes educator to advise whether there are any safer options to manage patient's type I diabetes upon discharge given concern for intentional insulin  overdose.   9/23: On interview today patient is noted to be seated on his bed.  He is calm and cooperative.  Patient denies current depressive symptoms or anxiety.  He is sleeping well and appetite is normal.  He denies SI/HI/plan and denies hallucinations.  He has been compliant with current medication regimen of Prozac  10 mg once daily and denies adverse effects.  Patient states he is looking to set up outpatient follow-up appointments on his own but will also accept help from social work as needed. He does not voice any concerns or  complaints today.   9/22: On interview today patient is noted to be seated on his bed.  He is pleasant and cooperative.  Patient initially presented with worsening depressive symptoms including lack of motivation, anhedonia and intentional overdose with insulin .  He denies depressive symptoms today and reports that overdose of  insulin  was accidental.  He has been compliant with psychotropic medication, fluoxetine  10 mg once daily and is tolerating this well.  He rates depression as 0 out of 10 and anxiety as 0 out of 10 today.  He denies SI/HI/plan and denies hallucinations.  He endorses history of suicidal ideation a long time ago.  He does not voice any concerns or complaints today.  Sleep: Good  Appetite:  Good  Past Psychiatric History: see h&P Family History: History reviewed. No pertinent family history. Social History:  Social History   Substance and Sexual Activity  Alcohol Use Yes   Alcohol/week: 1.0 standard drink of alcohol   Types: 1 Cans of beer per week     Social History   Substance and Sexual Activity  Drug Use No    Social History   Socioeconomic History   Marital status: Significant Other    Spouse name: Not on file   Number of children: Not on file   Years of education: Not on file   Highest education level: Not on file  Occupational History   Not on file  Tobacco Use   Smoking status: Never   Smokeless tobacco: Never  Vaping Use   Vaping status: Never Used  Substance and Sexual Activity   Alcohol use: Yes    Alcohol/week: 1.0 standard drink of alcohol    Types: 1 Cans of beer per week   Drug use: No   Sexual activity: Yes  Other Topics Concern   Not on file  Social History Narrative   Not on file   Social Drivers of Health   Financial Resource Strain: Low Risk  (11/01/2023)   Received from [facility]   Overall Financial Resource Strain (CARDIA)    Difficulty of Paying Living Expenses: Not very hard  Food Insecurity: No Food Insecurity (11/06/2023)   Hunger Vital Sign    Worried About Running Out of Food in the Last Year: Never true    Ran Out of Food in the Last Year: Never true  Transportation Needs: No Transportation Needs (11/06/2023)   PRAPARE - Administrator, Civil Service (Medical): No    Lack of Transportation  (Non-Medical): No  Physical Activity: Not on file  Stress: Stress Concern Present (03/24/2021)   Received from [facility] - Occupational Stress Questionnaire    Feeling of Stress : To some extent  Social Connections: Not on file   Past Medical History:  Past Medical History:  Diagnosis Date   Diabetes mellitus without complication (HCC)    Insulin  Pump   History reviewed. No pertinent surgical history.  Current Medications: Current Facility-Administered Medications  Medication Dose Route Frequency Provider Last Rate Last Admin   acetaminophen  (TYLENOL ) tablet 650 mg  650 mg Oral Q6H PRN Smith, Annie B, NP       alum & mag hydroxide-simeth (MAALOX/MYLANTA) 200-200-20 MG/5ML suspension 30 mL  30 mL Oral Q4H PRN Smith, Annie B, NP       haloperidol  (HALDOL ) tablet 5 mg  5 mg Oral TID PRN Smith, Annie B, NP       And  diphenhydrAMINE  (BENADRYL ) capsule 50 mg  50 mg Oral TID PRN Smith, Annie B, NP       haloperidol  lactate (HALDOL ) injection 5 mg  5 mg Intramuscular TID PRN Smith, Annie B, NP       And   diphenhydrAMINE  (BENADRYL ) injection 50 mg  50 mg Intramuscular TID PRN Smith, Annie B, NP       And   LORazepam  (ATIVAN ) injection 2 mg  2 mg Intramuscular TID PRN Smith, Annie B, NP       haloperidol  lactate (HALDOL ) injection 10 mg  10 mg Intramuscular TID PRN Smith, Annie B, NP       And   diphenhydrAMINE  (BENADRYL ) injection 50 mg  50 mg Intramuscular TID PRN Smith, Annie B, NP       And   LORazepam  (ATIVAN ) injection 2 mg  2 mg Intramuscular TID PRN Smith, Annie B, NP       FLUoxetine  (PROZAC ) capsule 10 mg  10 mg Oral Daily Smith, Annie B, NP   10 mg at 11/11/23 0827   hydrOXYzine  (ATARAX ) tablet 10 mg  10 mg Oral TID PRN Smith, Annie B, NP       insulin  aspart (novoLOG ) injection 0-15 Units  0-15 Units Subcutaneous TID WC Smith, Annie B, NP   2 Units at 11/10/23 1713   insulin  aspart (novoLOG ) injection 0-5 Units  0-5  Units Subcutaneous QHS Smith, Annie B, NP       insulin  aspart (novoLOG ) injection 5 Units  5 Units Subcutaneous TID WC Jadapalle, Sree, MD   5 Units at 11/11/23 1255   insulin  glargine (LANTUS ) injection 23 Units  23 Units Subcutaneous Q24H Donnelly Mellow, MD       magnesium  hydroxide (MILK OF MAGNESIA) suspension 30 mL  30 mL Oral Daily PRN Smith, Annie B, NP       traZODone  (DESYREL ) tablet 50 mg  50 mg Oral QHS PRN Smith, Annie B, NP        Lab Results:  Results for orders placed or performed during the hospital encounter of 11/06/23 (from the past 48 hours)  Glucose, capillary     Status: Abnormal   Collection Time: 11/09/23  4:26 PM  Result Value Ref Range   Glucose-Capillary 167 (H) 70 - 99 mg/dL    Comment: Glucose reference range applies only to samples taken after fasting for at least 8 hours.  Glucose, capillary     Status: Abnormal   Collection Time: 11/09/23  8:02 PM  Result Value Ref Range   Glucose-Capillary 139 (H) 70 - 99 mg/dL    Comment: Glucose reference range applies only to samples taken after fasting for at least 8 hours.  Glucose, capillary     Status: Abnormal   Collection Time: 11/10/23  7:44 AM  Result Value Ref Range   Glucose-Capillary 113 (H) 70 - 99 mg/dL    Comment: Glucose reference range applies only to samples taken after fasting for at least 8 hours.  Glucose, capillary     Status: Abnormal   Collection Time: 11/10/23 11:29 AM  Result Value Ref Range   Glucose-Capillary 112 (H) 70 - 99 mg/dL    Comment: Glucose reference range applies only to samples taken after fasting for at least 8 hours.   Comment 1 Notify RN   Glucose, capillary     Status: Abnormal   Collection Time: 11/10/23  4:22 PM  Result Value Ref Range   Glucose-Capillary 143 (H) 70 - 99 mg/dL  Comment: Glucose reference range applies only to samples taken after fasting for at least 8 hours.  Glucose, capillary     Status: None   Collection Time: 11/10/23  8:02 PM  Result Value  Ref Range   Glucose-Capillary 76 70 - 99 mg/dL    Comment: Glucose reference range applies only to samples taken after fasting for at least 8 hours.  Glucose, capillary     Status: None   Collection Time: 11/11/23  7:29 AM  Result Value Ref Range   Glucose-Capillary 88 70 - 99 mg/dL    Comment: Glucose reference range applies only to samples taken after fasting for at least 8 hours.  Glucose, capillary     Status: None   Collection Time: 11/11/23 11:22 AM  Result Value Ref Range   Glucose-Capillary 91 70 - 99 mg/dL    Comment: Glucose reference range applies only to samples taken after fasting for at least 8 hours.    Blood Alcohol level:  Lab Results  Component Value Date   [facility] <15 11/04/2023   ETH <10 11/21/2017    Metabolic Disorder Labs: Lab Results  Component Value Date   HGBA1C 10.3 (H) 11/04/2023   MPG 248.91 11/04/2023   No results found for: PROLACTIN No results found for: CHOL, TRIG, HDL, CHOLHDL, VLDL, LDLCALC  Physical Findings: AIMS:  , ,  ,  ,    CIWA:    COWS:      Psychiatric Specialty Exam:  Presentation  General Appearance:  Appropriate for Environment  Eye Contact: Fair  Speech: Clear and Coherent  Speech Volume: Normal    Mood and Affect  Mood: Euthymic  Affect: Appropriate   Thought Process  Thought Processes: Coherent  Descriptions of Associations: Intact Orientation:Full (Time, Place and Person)  Thought Content:WDL  Hallucinations: none  Ideas of Reference:None  Suicidal Thoughts:No   Homicidal Thoughts:No   Sensorium  Memory: Immediate Good; Recent Good; Remote Good  Judgment: Fair  Insight: Fair   Chartered certified accountant: Fair  Attention Span: Fair  Recall: Good  Fund of Knowledge: Good  Language: Good   Psychomotor Activity  Psychomotor Activity: Normal  Musculoskeletal: Strength & Muscle Tone: within normal limits Gait & Station: normal Assets   Assets: Social Support; Manufacturing systems engineer; Desire for Improvement    Physical Exam: Physical Exam ROS Blood pressure 120/78, pulse 94, temperature (!) 97.5 F (36.4 C), resp. rate 16, height 5' 9 (1.753 m), weight 65.8 kg, SpO2 99%. Body mass index is 21.41 kg/m.  Diagnosis: Principal Problem:   Severe recurrent major depression without psychotic features (HCC)   PLAN: Safety and Monitoring:  -- Voluntary admission to inpatient psychiatric unit for safety, stabilization and treatment  -- Daily contact with patient to assess and evaluate symptoms and progress in treatment  -- Patient's case to be discussed in multi-disciplinary team meeting  -- Observation Level : q15 minute checks  -- Vital signs:  q12 hours  -- Precautions: suicide, elopement, and assault -- Encouraged patient to participate in unit milieu and in scheduled group therapies   2. Psychiatric Diagnoses and Treatment:  MDD, recurrent, severe Continue Prozac  10 mg once daily    -- The risks/benefits/side-effects/alternatives to this medication were discussed in detail with the patient and time was given for questions. The patient consents to medication trial.                -- Metabolic profile and EKG monitoring obtained while on an atypical antipsychotic (BMI: Lipid Panel:  HbgA1c: QTc:)              -- Encouraged patient to participate in unit milieu and in scheduled group therapies    3. Medical Issues Being Addressed:  DM type I Diabetes coordinator helping to manage and advise on safe discharge planning  4. Discharge Planning:             -- Monday or Tuesday  -- Social work and case management to assist with discharge planning and identification of hospital follow-up needs prior to discharge  -- Estimated LOS: 3-4 days  [employer], PA-C 11/11/2023, 1:13 PM

## 2023-11-11 NOTE — Group Note (Deleted)
 Date:  11/11/2023 Time:  10:40 PM  Group Topic/Focus:  Emotional Education:   The focus of this group is to discuss what feelings/emotions are, and how they are experienced.     Participation Level:  {BHH PARTICIPATION OZCZO:77735}  Participation Quality:  {BHH PARTICIPATION QUALITY:22265}  Affect:  {BHH AFFECT:22266}  Cognitive:  {BHH COGNITIVE:22267}  Insight: {BHH Insight2:20797}  Engagement in Group:  {BHH ENGAGEMENT IN HMNLE:77731}  Modes of Intervention:  {BHH MODES OF INTERVENTION:22269}  Additional Comments:  ***  Weiland Tomich L 11/11/2023, 10:40 PM

## 2023-11-11 NOTE — Plan of Care (Signed)
  Problem: Education: Goal: Knowledge of the prescribed therapeutic regimen will improve Outcome: Progressing   Problem: Activity: Goal: Interest or engagement in leisure activities will improve Outcome: Progressing   Problem: Self-Concept: Goal: Level of anxiety will decrease Outcome: Progressing

## 2023-11-11 NOTE — Progress Notes (Signed)
   11/10/23 2100  Psych Admission Type (Psych Patients Only)  Admission Status Involuntary  Psychosocial Assessment  Patient Complaints None  Eye Contact Fair  Facial Expression Animated  Affect Appropriate to circumstance  Speech Logical/coherent  Interaction Assertive  Motor Activity Pacing  Appearance/Hygiene Unremarkable  Behavior Characteristics Cooperative;Appropriate to situation  Mood Pleasant  Thought Process  Coherency WDL  Content WDL  Delusions None reported or observed  Perception WDL  Hallucination None reported or observed  Judgment WDL  Confusion None  Danger to Self  Current suicidal ideation? Denies  Self-Injurious Behavior Self-injurious ideation with potentially lethal plan observed or expressed  Agreement Not to Harm Self Yes  Description of Agreement [place]  Danger to Others  Danger to Others None reported or observed

## 2023-11-11 NOTE — Plan of Care (Signed)
  Problem: Self-Concept: Goal: Will verbalize positive feelings about self Outcome: Progressing   

## 2023-11-11 NOTE — Group Note (Signed)
 Recreation Therapy Group Note   Group Topic:Leisure Education  Group Date: 11/11/2023 Start Time: 1300 End Time: 1400 Facilitators: Celestia Jeoffrey BRAVO, LRT, CTRS Location: Craft Room  Group Description: Leisure. Patients were given the option to choose from journaling, coloring, drawing, making origami, playing with playdoh, listening to music or singing karaoke. LRT and pts discussed the meaning of leisure, the importance of participating in leisure during their free time/when they're outside of the hospital, as well as how our leisure interests can also serve as coping skills.   Goal Area(s) Addressed:  Patient will identify a current leisure interest.  Patient will learn the definition of "leisure". Patient will practice making a positive decision. Patient will have the opportunity to try a new leisure activity. Patient will communicate with peers and LRT.    Affect/Mood: Appropriate   Participation Level: Active and Engaged   Participation Quality: Independent   Behavior: Appropriate, Calm, and Cooperative   Speech/Thought Process: Coherent   Insight: Good   Judgement: Good   Modes of Intervention: Clarification, Education, Exploration, and Music   Patient Response to Interventions:  Attentive, Engaged, Interested , and Receptive   Education Outcome:  Acknowledges education   Clinical Observations/Individualized Feedback: Treyshon was active in their participation of session activities and group discussion. Pt identified write and spend time with my son as things he does in his free time. Pt chose to play a board game with peers while in group.    Plan: Continue to engage patient in RT group sessions 2-3x/week.   Jeoffrey BRAVO Celestia, LRT, CTRS 11/11/2023 3:23 PM

## 2023-11-11 NOTE — Progress Notes (Signed)
   11/11/23 1000  Psych Admission Type (Psych Patients Only)  Admission Status Involuntary  Psychosocial Assessment  Patient Complaints Worrying  Eye Contact Fair  Facial Expression Animated  Affect Appropriate to circumstance  Speech Logical/coherent  Interaction Assertive  Motor Activity Other (Comment) (appropriate for developmental age)  Appearance/Hygiene Unremarkable  Behavior Characteristics Cooperative  Mood Pleasant;Euthymic  Thought Process  Coherency WDL  Content WDL  Delusions None reported or observed  Perception WDL  Hallucination None reported or observed  Judgment WDL  Confusion None  Danger to Self  Current suicidal ideation? Denies  Self-Injurious Behavior No self-injurious ideation or behavior indicators observed or expressed   Danger to Others  Danger to Others None reported or observed

## 2023-11-11 NOTE — Group Note (Signed)
 Recreation Therapy Group Note   Group Topic:Health and Wellness  Group Date: 11/11/2023 Start Time: 1020 End Time: 1115 Facilitators: Celestia Jeoffrey BRAVO, LRT, CTRS Location: Courtyard  Group Description: [employer]. LRT and patients played games of basketball, drew with chalk, and played corn hole while outside in the courtyard while getting fresh air and sunlight. Music was being played in the background. LRT and peers conversed about different games they have played before, what they do in their free time and anything else that is on their minds. LRT encouraged pts to drink water after being outside, sweating and getting their heart rate up.  Goal Area(s) Addressed: Patient will build on frustration tolerance skills. Patients will partake in a competitive play game with peers. Patients will gain knowledge of new leisure interest/hobby.     Affect/Mood: Appropriate   Participation Level: Active   Participation Quality: Independent   Behavior: Appropriate   Speech/Thought Process: Coherent   Insight: Good   Judgement: Good   Modes of Intervention: Activity   Patient Response to Interventions:  Receptive   Education Outcome:  Acknowledges education   Clinical Observations/Individualized Feedback: Jacob Burton was active in their participation of session activities and group discussion. Pt interacted well with LRT and peers duration of session.    Plan: Continue to engage patient in RT group sessions 2-3x/week.   Jeoffrey BRAVO Celestia, LRT, CTRS 11/11/2023 11:47 AM

## 2023-11-12 DIAGNOSIS — F332 Major depressive disorder, recurrent severe without psychotic features: Secondary | ICD-10-CM

## 2023-11-12 LAB — GLUCOSE, CAPILLARY
Glucose-Capillary: 289 mg/dL — ABNORMAL HIGH (ref 70–99)
Glucose-Capillary: 113 mg/dL — ABNORMAL HIGH (ref 70–99)
Glucose-Capillary: 149 mg/dL — ABNORMAL HIGH (ref 70–99)

## 2023-11-12 NOTE — Progress Notes (Signed)
 [facility] MD Progress Note  11/12/2023 3:37 PM Jacob Burton  MRN:  [id number]   Subjective:  Chart reviewed, case discussed in multidisciplinary meeting, patient seen during rounds.   9/27: On review today patient is alert and orient they are pleasant and cooperative.  They are linear logical and future oriented.  They deny SI, HI, and AVH.  He questions about discharge.  He acknowledges ongoing need of insulin .  He knowledges that he concerned given the recent insulin  overdose.  He denies any intent of the overdose.  He indicates he does have outpatient follow-up with endocrinology.  Discussed access to the insulin  pens and he indicates he has some at home.  Reports he lives with his dad.  Reports he is anxious about not seeing his son but otherwise feels that he is doing well.  Discussed plans for Monday Tuesday discharge and the need for advanced safety planning given ongoing insulin  needs and this incident that led to the admission.  He acknowledges.  He is able to discuss support system, coping skills, and crisis resources.  He has not required any behavioral PRNs.  9/26: On interview today patient is noted to be seated seated in his room.  He is calm and cooperative.  He denies current symptoms of anxiety or depression.  He denies SI/HI/plan and denies hallucinations.  He is future oriented stating he is looking forward to seeing his son upon discharge.  Diabetes educator recommended changes to insulin  regimen which have been ordered.  Diabetes educator also recommended insulin  pens as safer option considering patient's recent overdose on insulin .  She states patient will require insulin  to keep DM controlled. She further advises that insulin  pump also carries risk as he could overdose on an insulin  pump as well. TOC is involved in case to assist with getting the Basaglar  and Humalog pens, and [facility] needles from [facility]. Hospital financial counselors also involved in case. Medicaid  referral has been placed.  Patient is established with an endocrinologist at [facility] clinic.   9/25: On interview today patient is noted to be sitting in his room.  He is calm and cooperative.  He denies current symptoms of depression or anxiety.  He denies SI/HI/plan and denies hallucinations.  He is sleeping well and states appetite is normal.  He is tolerating current medication regimen well without adverse effects.  He has been able to speak with his father and his girlfriend on the phone during his stay.  Per nursing report, patient has been compliant with medications and participating in group therapies.  Patient states he previously used a insulin  pump to manage type 1 diabetes but states this is currently cost prohibitive.  Patient is working with provider, diabetes coordinator, and social work team for safe discharge planning.  Team is discussing ways to help minimize risk of future insulin  overdose.  9/24: On interview today, patient is noted to be seated in his room.  He is pleasant and cooperative.  He denies current symptoms of anxiety and depression rating both as 0 out of 10 today.  He denies SI/HI/plan and denies hallucinations.  When asked again whether he thought insulin  overdose was accidental or intentional patient replies he does not believe it was intentional but states after his blood sugar initially dropped he does not recall his frame of mind.  Patient has been attending group therapy and interacting well with peers.  He states he finds group therapies to be beneficial.  He reports tolerating current medication  Prozac  10 mg once daily and denies adverse effects.  He is accepting help from social work team regarding outpatient follow-up.  Patient gave consent to contact his fiance and father regarding care plan and safe discharge planning. Consult placed to diabetes educator to advise whether there are any safer options to manage patient's type I diabetes upon discharge given  concern for intentional insulin  overdose.   9/23: On interview today patient is noted to be seated on his bed.  He is calm and cooperative.  Patient denies current depressive symptoms or anxiety.  He is sleeping well and appetite is normal.  He denies SI/HI/plan and denies hallucinations.  He has been compliant with current medication regimen of Prozac  10 mg once daily and denies adverse effects.  Patient states he is looking to set up outpatient follow-up appointments on his own but will also accept help from social work as needed. He does not voice any concerns or complaints today.   9/22: On interview today patient is noted to be seated on his bed.  He is pleasant and cooperative.  Patient initially presented with worsening depressive symptoms including lack of motivation, anhedonia and intentional overdose with insulin .  He denies depressive symptoms today and reports that overdose of insulin  was accidental.  He has been compliant with psychotropic medication, fluoxetine  10 mg once daily and is tolerating this well.  He rates depression as 0 out of 10 and anxiety as 0 out of 10 today.  He denies SI/HI/plan and denies hallucinations.  He endorses history of suicidal ideation a long time ago.  He does not voice any concerns or complaints today.  Sleep: Good  Appetite:  Good  Past Psychiatric History: see h&P Family History: History reviewed. No pertinent family history. Social History:  Social History   Substance and Sexual Activity  Alcohol Use Yes   Alcohol/week: 1.0 standard drink of alcohol   Types: 1 Cans of beer per week     Social History   Substance and Sexual Activity  Drug Use No    Social History   Socioeconomic History   Marital status: Significant Other    Spouse name: Not on file   Number of children: Not on file   Years of education: Not on file   Highest education level: Not on file  Occupational History   Not on file  Tobacco Use   Smoking status: Never    Smokeless tobacco: Never  Vaping Use   Vaping status: Never Used  Substance and Sexual Activity   Alcohol use: Yes    Alcohol/week: 1.0 standard drink of alcohol    Types: 1 Cans of beer per week   Drug use: No   Sexual activity: Yes  Other Topics Concern   Not on file  Social History Narrative   Not on file   Social Drivers of Health   Financial Resource Strain: Low Risk  (11/01/2023)   Received from [facility]   Overall Financial Resource Strain (CARDIA)    Difficulty of Paying Living Expenses: Not very hard  Food Insecurity: No Food Insecurity (11/06/2023)   Hunger Vital Sign    Worried About Running Out of Food in the Last Year: Never true    Ran Out of Food in the Last Year: Never true  Transportation Needs: No Transportation Needs (11/06/2023)   PRAPARE - Administrator, Civil Service (Medical): No    Lack of Transportation (Non-Medical): No  Physical Activity: Not on file  Stress: Stress Concern Present (03/24/2021)   Received from [facility] - Occupational Stress Questionnaire    Feeling of Stress : To some extent  Social Connections: Not on file   Past Medical History:  Past Medical History:  Diagnosis Date   Diabetes mellitus without complication (HCC)    Insulin  Pump   History reviewed. No pertinent surgical history.  Current Medications: Current Facility-Administered Medications  Medication Dose Route Frequency Provider Last Rate Last Admin   acetaminophen  (TYLENOL ) tablet 650 mg  650 mg Oral Q6H PRN Smith, Annie B, NP       alum & mag hydroxide-simeth (MAALOX/MYLANTA) 200-200-20 MG/5ML suspension 30 mL  30 mL Oral Q4H PRN Smith, Annie B, NP       haloperidol  (HALDOL ) tablet 5 mg  5 mg Oral TID PRN Smith, Annie B, NP       And   diphenhydrAMINE  (BENADRYL ) capsule 50 mg  50 mg Oral TID PRN Smith, Annie B, NP       haloperidol  lactate (HALDOL ) injection 5 mg  5 mg  Intramuscular TID PRN Smith, Annie B, NP       And   diphenhydrAMINE  (BENADRYL ) injection 50 mg  50 mg Intramuscular TID PRN Smith, Annie B, NP       And   LORazepam  (ATIVAN ) injection 2 mg  2 mg Intramuscular TID PRN Smith, Annie B, NP       haloperidol  lactate (HALDOL ) injection 10 mg  10 mg Intramuscular TID PRN Smith, Annie B, NP       And   diphenhydrAMINE  (BENADRYL ) injection 50 mg  50 mg Intramuscular TID PRN Smith, Annie B, NP       And   LORazepam  (ATIVAN ) injection 2 mg  2 mg Intramuscular TID PRN Smith, Annie B, NP       FLUoxetine  (PROZAC ) capsule 10 mg  10 mg Oral Daily Smith, Annie B, NP   10 mg at 11/12/23 9162   hydrOXYzine  (ATARAX ) tablet 10 mg  10 mg Oral TID PRN Smith, Annie B, NP       insulin  aspart (novoLOG ) injection 0-15 Units  0-15 Units Subcutaneous TID WC Smith, Annie B, NP   8 Units at 11/12/23 9162   insulin  aspart (novoLOG ) injection 0-5 Units  0-5 Units Subcutaneous QHS Smith, Annie B, NP       insulin  aspart (novoLOG ) injection 5 Units  5 Units Subcutaneous TID WC Jadapalle, Sree, MD   5 Units at 11/12/23 1210   insulin  glargine (LANTUS ) injection 23 Units  23 Units Subcutaneous Q24H Jadapalle, Sree, MD   23 Units at 11/12/23 1443   magnesium  hydroxide (MILK OF MAGNESIA) suspension 30 mL  30 mL Oral Daily PRN Smith, Annie B, NP       traZODone  (DESYREL ) tablet 50 mg  50 mg Oral QHS PRN Smith, Annie B, NP        Lab Results:  Results for orders placed or performed during the hospital encounter of 11/06/23 (from the past 48 hours)  Glucose, capillary     Status: Abnormal   Collection Time: 11/10/23  4:22 PM  Result Value Ref Range   Glucose-Capillary 143 (H) 70 - 99 mg/dL    Comment: Glucose reference range applies only to samples taken after fasting for at least 8 hours.  Glucose, capillary     Status: None   Collection Time: 11/10/23  8:02 PM  Result Value Ref Range  Glucose-Capillary 76 70 - 99 mg/dL    Comment: Glucose reference range applies only to  samples taken after fasting for at least 8 hours.  Glucose, capillary     Status: None   Collection Time: 11/11/23  7:29 AM  Result Value Ref Range   Glucose-Capillary 88 70 - 99 mg/dL    Comment: Glucose reference range applies only to samples taken after fasting for at least 8 hours.  Glucose, capillary     Status: None   Collection Time: 11/11/23 11:22 AM  Result Value Ref Range   Glucose-Capillary 91 70 - 99 mg/dL    Comment: Glucose reference range applies only to samples taken after fasting for at least 8 hours.  Glucose, capillary     Status: Abnormal   Collection Time: 11/11/23  4:21 PM  Result Value Ref Range   Glucose-Capillary 142 (H) 70 - 99 mg/dL    Comment: Glucose reference range applies only to samples taken after fasting for at least 8 hours.  Glucose, capillary     Status: Abnormal   Collection Time: 11/12/23  7:46 AM  Result Value Ref Range   Glucose-Capillary 289 (H) 70 - 99 mg/dL    Comment: Glucose reference range applies only to samples taken after fasting for at least 8 hours.  Glucose, capillary     Status: Abnormal   Collection Time: 11/12/23 11:49 AM  Result Value Ref Range   Glucose-Capillary 113 (H) 70 - 99 mg/dL    Comment: Glucose reference range applies only to samples taken after fasting for at least 8 hours.   Comment 1 Notify RN     Blood Alcohol level:  Lab Results  Component Value Date   ETH <15 11/04/2023   ETH <10 11/21/2017    Metabolic Disorder Labs: Lab Results  Component Value Date   HGBA1C 10.3 (H) 11/04/2023   MPG 248.91 11/04/2023   No results found for: PROLACTIN No results found for: CHOL, TRIG, HDL, CHOLHDL, VLDL, LDLCALC  Physical Findings: AIMS:  , ,  ,  ,    CIWA:    COWS:      Psychiatric Specialty Exam:  Presentation  General Appearance:  Appropriate for Environment  Eye Contact: Fair  Speech: Clear and Coherent  Speech Volume: Normal    Mood and Affect   Mood: Euthymic  Affect: Appropriate   Thought Process  Thought Processes: Coherent  Descriptions of Associations: Intact Orientation:Full (Time, Place and Person)  Thought Content:WDL  Hallucinations: none  Ideas of Reference:None  Suicidal Thoughts:No   Homicidal Thoughts:No   Sensorium  Memory: Immediate Good; Recent Good; Remote Good  Judgment: Fair  Insight: Fair   Chartered certified accountant: Fair  Attention Span: Fair  Recall: Good  Fund of Knowledge: Good  Language: Good   Psychomotor Activity  Psychomotor Activity: Normal  Musculoskeletal: Strength & Muscle Tone: within normal limits Gait & Station: normal Assets  Assets: Social Support; Manufacturing systems engineer; Desire for Improvement    Physical Exam: Physical Exam Vitals and nursing note reviewed.  HENT:     Head: Atraumatic.  Eyes:     Extraocular Movements: Extraocular movements intact.  Pulmonary:     Effort: Pulmonary effort is normal.  Neurological:     Mental Status: He is alert and oriented to person, place, and time.    Review of Systems  Psychiatric/Behavioral:  Negative for suicidal ideas. The patient is nervous/anxious.    Blood pressure 131/79, pulse 99, temperature (!) 97.2 F (36.2 C),  resp. rate 18, height 5' 9 (1.753 m), weight 65.8 kg, SpO2 98%. Body mass index is 21.41 kg/m.  Diagnosis: Principal Problem:   Severe recurrent major depression without psychotic features (HCC)   PLAN: Safety and Monitoring:  -- Voluntary admission to inpatient psychiatric unit for safety, stabilization and treatment  -- Daily contact with patient to assess and evaluate symptoms and progress in treatment  -- Patient's case to be discussed in multi-disciplinary team meeting  -- Observation Level : q15 minute checks  -- Vital signs:  q12 hours  -- Precautions: suicide, elopement, and assault -- Encouraged patient to participate in unit milieu and in scheduled  group therapies   2. Psychiatric Diagnoses and Treatment:  MDD, recurrent, severe Continue Prozac  10 mg once daily    -- The risks/benefits/side-effects/alternatives to this medication were discussed in detail with the patient and time was given for questions. The patient consents to medication trial.                -- Metabolic profile and EKG monitoring obtained while on an atypical antipsychotic (BMI: Lipid Panel: HbgA1c: QTc:)              -- Encouraged patient to participate in unit milieu and in scheduled group therapies    3. Medical Issues Being Addressed:  DM type I Diabetes coordinator helping to manage and advise on safe discharge planning  4. Discharge Planning:             -- Monday or Tuesday  -- Social work and case management to assist with discharge planning and identification of hospital follow-up needs prior to discharge  -- Estimated LOS: 3-4 days  Donnice FORBES Right, PA-C 11/12/2023, 3:37 PM

## 2023-11-12 NOTE — Progress Notes (Signed)
   11/12/23 0837  Psych Admission Type (Psych Patients Only)  Admission Status Involuntary  Psychosocial Assessment  Patient Complaints None  Eye Contact Darting  Facial Expression Anxious (Patient states he's anxious to go home)  Affect Anxious  Speech Logical/coherent  Interaction Assertive  Motor Activity Slow  Appearance/Hygiene Unremarkable  Behavior Characteristics Cooperative  Mood Pleasant  Aggressive Behavior  Effect No apparent injury  Thought Process  Coherency WDL  Content WDL  Delusions None reported or observed  Perception WDL  Hallucination None reported or observed  Judgment WDL  Confusion WDL  Danger to Self  Current suicidal ideation? Denies  Self-Injurious Behavior No self-injurious ideation or behavior indicators observed or expressed   Agreement Not to Harm Self Yes  Description of Agreement verbal  Danger to Others  Danger to Others None reported or observed

## 2023-11-12 NOTE — Progress Notes (Signed)
   11/11/23 2000  Psych Admission Type (Psych Patients Only)  Admission Status Involuntary  Psychosocial Assessment  Patient Complaints Worrying  Eye Contact Brief  Facial Expression Anxious  Affect Apprehensive  Speech Logical/coherent  Interaction Assertive  Motor Activity Slow  Appearance/Hygiene Unremarkable  Behavior Characteristics Cooperative;Appropriate to situation  Mood Pleasant  Thought Process  Content WDL  Delusions WDL  Hallucination None reported or observed  Judgment WDL  Confusion WDL  Danger to Self  Current suicidal ideation? Denies   No distress noted, interacting appropriately with peers and staff and denies SI/HI/AVH.

## 2023-11-12 NOTE — Group Note (Signed)
 Date:  11/12/2023 Time:  12:50 PM  Group Topic/Focus:  Personal Choices and Values:   The focus of this group is to help patients assess and explore the importance of values in their lives, how their values affect their decisions, how they express their values and what opposes their expression.    Participation Level:  Active  Participation Quality:  Appropriate  Affect:  Appropriate  Cognitive:  Appropriate  Insight: Appropriate  Engagement in Group:  Engaged  Modes of Intervention:  Discussion, Education, and Support  Additional Comments:    Deitra Caron Mainland 11/12/2023, 12:50 PM

## 2023-11-12 NOTE — Plan of Care (Signed)
  Problem: Education: Goal: Utilization of techniques to improve thought processes will improve Outcome: Progressing   Problem: Education: Goal: Knowledge of the prescribed therapeutic regimen will improve Outcome: Progressing   Problem: Activity: Goal: Interest or engagement in leisure activities will improve Outcome: Progressing

## 2023-11-12 NOTE — BH IP Treatment Plan (Signed)
 Interdisciplinary Treatment and Diagnostic Plan Update  11/12/2023 Time of Session: 2:39pm Jacob Burton MRN: [id number]  Principal Diagnosis: Severe recurrent major depression without psychotic features [facility])  Secondary Diagnoses: Principal Problem:   Severe recurrent major depression without psychotic features (HCC)   Current Medications:  Current Facility-Administered Medications  Medication Dose Route Frequency Provider Last Rate Last Admin   acetaminophen  (TYLENOL ) tablet 650 mg  650 mg Oral Q6H PRN Smith, Annie B, NP       alum & mag hydroxide-simeth (MAALOX/MYLANTA) 200-200-20 MG/5ML suspension 30 mL  30 mL Oral Q4H PRN Smith, Annie B, NP       haloperidol  (HALDOL ) tablet 5 mg  5 mg Oral TID PRN Smith, Annie B, NP       And   diphenhydrAMINE  (BENADRYL ) capsule 50 mg  50 mg Oral TID PRN Smith, Annie B, NP       haloperidol  lactate (HALDOL ) injection 5 mg  5 mg Intramuscular TID PRN Smith, Annie B, NP       And   diphenhydrAMINE  (BENADRYL ) injection 50 mg  50 mg Intramuscular TID PRN Smith, Annie B, NP       And   LORazepam  (ATIVAN ) injection 2 mg  2 mg Intramuscular TID PRN Smith, Annie B, NP       haloperidol  lactate (HALDOL ) injection 10 mg  10 mg Intramuscular TID PRN Smith, Annie B, NP       And   diphenhydrAMINE  (BENADRYL ) injection 50 mg  50 mg Intramuscular TID PRN Smith, Annie B, NP       And   LORazepam  (ATIVAN ) injection 2 mg  2 mg Intramuscular TID PRN Smith, Annie B, NP       FLUoxetine  (PROZAC ) capsule 10 mg  10 mg Oral Daily Smith, Annie B, NP   10 mg at 11/12/23 9162   hydrOXYzine  (ATARAX ) tablet 10 mg  10 mg Oral TID PRN Smith, Annie B, NP       insulin  aspart (novoLOG ) injection 0-15 Units  0-15 Units Subcutaneous TID WC Smith, Annie B, NP   8 Units at 11/12/23 9162   insulin  aspart (novoLOG ) injection 0-5 Units  0-5 Units Subcutaneous QHS Smith, Annie B, NP       insulin  aspart (novoLOG ) injection 5 Units  5 Units Subcutaneous TID WC Jadapalle, Sree, MD   5  Units at 11/12/23 1210   insulin  glargine (LANTUS ) injection 23 Units  23 Units Subcutaneous Q24H Jadapalle, Sree, MD   23 Units at 11/11/23 1540   magnesium  hydroxide (MILK OF MAGNESIA) suspension 30 mL  30 mL Oral Daily PRN Smith, Annie B, NP       traZODone  (DESYREL ) tablet 50 mg  50 mg Oral QHS PRN Smith, Annie B, NP       PTA Medications: Medications Prior to Admission  Medication Sig Dispense Refill Last Dose/Taking   acetaminophen  (TYLENOL ) 325 MG tablet Take 650 mg by mouth every 4 (four) hours as needed for mild pain (pain score 1-3).   Past Month   insulin  aspart (NOVOLOG ) 100 UNIT/ML injection See admin instructions. Inject up to 50u under the skin daily as directed   Taking   insulin  glargine (LANTUS ) 100 UNIT/ML injection Inject 12 Units into the skin 2 (two) times daily.   11/04/2023   vitamin C (ASCORBIC ACID) 500 MG tablet Take 500 mg by mouth daily.   Past Week   Zinc Citrate-Phytase (ZYTAZE) 25-500 MG CAPS Take 1 capsule by mouth daily.   11/03/2023  insulin  lispro (HUMALOG) 100 UNIT/ML injection See admin instructions. Use up to 90u daily via insulin  pump (Patient not taking: Reported on 11/04/2023)      metoCLOPramide  (REGLAN ) 10 MG tablet Take 1 tablet (10 mg total) by mouth every 8 (eight) hours as needed for nausea. (Patient not taking: Reported on 11/04/2023) 12 tablet 0    ondansetron  (ZOFRAN -ODT) 4 MG disintegrating tablet Take 1 tablet (4 mg total) by mouth every 8 (eight) hours as needed for nausea or vomiting. (Patient not taking: Reported on 11/04/2023) 12 tablet 0     Patient Stressors:    Patient Strengths:    Treatment Modalities: Medication Management, Group therapy, Case management,  1 to 1 session with clinician, Psychoeducation, Recreational therapy.   Physician Treatment Plan for Primary Diagnosis: Severe recurrent major depression without psychotic features (HCC) Long Term Goal(s): Improvement in symptoms so as ready for discharge   Short Term Goals:  Ability to identify changes in lifestyle to reduce recurrence of condition will improve Ability to verbalize feelings will improve Ability to disclose and discuss suicidal ideas Ability to demonstrate self-control will improve  Medication Management: Evaluate patient's response, side effects, and tolerance of medication regimen.  Therapeutic Interventions: 1 to 1 sessions, Unit Group sessions and Medication administration.  Evaluation of Outcomes: Progressing  Physician Treatment Plan for Secondary Diagnosis: Principal Problem:   Severe recurrent major depression without psychotic features (HCC)  Long Term Goal(s): Improvement in symptoms so as ready for discharge   Short Term Goals: Ability to identify changes in lifestyle to reduce recurrence of condition will improve Ability to verbalize feelings will improve Ability to disclose and discuss suicidal ideas Ability to demonstrate self-control will improve     Medication Management: Evaluate patient's response, side effects, and tolerance of medication regimen.  Therapeutic Interventions: 1 to 1 sessions, Unit Group sessions and Medication administration.  Evaluation of Outcomes: Progressing   RN Treatment Plan for Primary Diagnosis: Severe recurrent major depression without psychotic features (HCC) Long Term Goal(s): Knowledge of disease and therapeutic regimen to maintain health will improve  Short Term Goals: Ability to remain free from injury will improve, Ability to verbalize frustration and anger appropriately will improve, Ability to demonstrate self-control, Ability to participate in decision making will improve, Ability to verbalize feelings will improve, Ability to disclose and discuss suicidal ideas, Ability to identify and develop effective coping behaviors will improve, and Compliance with prescribed medications will improve  Medication Management: RN will administer medications as ordered by provider, will assess and  evaluate patient's response and provide education to patient for prescribed medication. RN will report any adverse and/or side effects to prescribing provider.  Therapeutic Interventions: 1 on 1 counseling sessions, Psychoeducation, Medication administration, Evaluate responses to treatment, Monitor vital signs and CBGs as ordered, Perform/monitor CIWA, COWS, AIMS and Fall Risk screenings as ordered, Perform wound care treatments as ordered.  Evaluation of Outcomes: Progressing   LCSW Treatment Plan for Primary Diagnosis: Severe recurrent major depression without psychotic features (HCC) Long Term Goal(s): Safe transition to appropriate next level of care at discharge, Engage patient in therapeutic group addressing interpersonal concerns.  Short Term Goals: Engage patient in aftercare planning with referrals and resources, Increase social support, Increase ability to appropriately verbalize feelings, Increase emotional regulation, Facilitate acceptance of mental health diagnosis and concerns, Facilitate patient progression through stages of change regarding substance use diagnoses and concerns, Identify triggers associated with mental health/substance abuse issues, and Increase skills for wellness and recovery  Therapeutic Interventions: Assess for  all discharge needs, 1 to 1 time with Child psychotherapist, Explore available resources and support systems, Assess for adequacy in community support network, Educate family and significant other(s) on suicide prevention, Complete Psychosocial Assessment, Interpersonal group therapy.  Evaluation of Outcomes: Progressing   Progress in Treatment: Attending groups: Yes. Participating in groups: Yes. Taking medication as prescribed: Yes. Toleration medication: Yes. Family/Significant other contact made: Yes, individual(s) contacted:  Concha Cory Patient understands diagnosis: Yes. Discussing patient identified problems/goals with staff: Yes. Medical  problems stabilized or resolved: Yes. Denies suicidal/homicidal ideation: Yes. Issues/concerns per patient self-inventory: Yes. Other:   New problem(s) identified: No, Describe:  none identified. Update: 11/12/2023: No changes at this time.   New Short Term/Long Term Goal(s): medication management for mood stabilization; elimination of SI thoughts; development of comprehensive mental wellness plan. Update: 11/12/2023: No changes at this time.   Patient Goals:  I don't have any goals. I'm good. Update: 11/12/2023: No changes at this time.   Discharge Plan or Barriers: CSW will assist pt with development of an appropriate aftercare/discharge plan. Update: 11/12/2023: No changes at this time.   Reason for Continuation of Hospitalization: Depression Medication stabilization Suicidal ideation, Update: 11/12/2023: No changes at this time.   Estimated Length of Stay: 1-7 days. Update: 11/12/2023: No changes at this time.  Last 3 [place] Suicide Severity Risk Score: Flowsheet Row Admission (Current) from 11/06/2023 in [facility] ED from 11/04/2023 in [facility] Emergency Department at [facility] ED from 09/29/2022 in [facility] Emergency Department at [facility]  C-SSRS RISK CATEGORY High Risk High Risk No Risk    Last PHQ 2/9 Scores:     No data to display          Scribe for Treatment Team: Aldo CHRISTELLA Dorthy KEN 11/12/2023 2:39 PM

## 2023-11-12 NOTE — Plan of Care (Signed)
  Problem: Activity: Goal: Interest or engagement in leisure activities will improve Outcome: Progressing Goal: Imbalance in normal sleep/wake cycle will improve Outcome: Progressing   Problem: Coping: Goal: Coping ability will improve Outcome: Progressing Goal: Will verbalize feelings Outcome: Progressing   Problem: Health Behavior/Discharge Planning: Goal: Ability to make decisions will improve Outcome: Progressing Goal: Compliance with therapeutic regimen will improve Outcome: Progressing   Problem: Role Relationship: Goal: Will demonstrate positive changes in social behaviors and relationships Outcome: Progressing

## 2023-11-12 NOTE — Group Note (Signed)
 Date:  11/12/2023 Time:  11:54 PM  Group Topic/Focus:  Dimensions of Wellness:   The focus of this group is to introduce the topic of wellness and discuss the role each dimension of wellness plays in total health.    Participation Level:  Active  Participation Quality:  Appropriate  Affect:  Appropriate  Cognitive:  Appropriate  Insight: Appropriate  Engagement in Group:  Engaged  Modes of Intervention:  Education  Additional Comments:    Talal Fritchman L 11/12/2023, 11:54 PM

## 2023-11-13 DIAGNOSIS — F332 Major depressive disorder, recurrent severe without psychotic features: Secondary | ICD-10-CM | POA: Diagnosis not present

## 2023-11-13 LAB — GLUCOSE, CAPILLARY
Glucose-Capillary: 168 mg/dL — ABNORMAL HIGH (ref 70–99)
Glucose-Capillary: 97 mg/dL (ref 70–99)
Glucose-Capillary: 147 mg/dL — ABNORMAL HIGH (ref 70–99)
Glucose-Capillary: 264 mg/dL — ABNORMAL HIGH (ref 70–99)

## 2023-11-13 NOTE — Progress Notes (Signed)
 [facility] MD Progress Note  11/13/2023 1:36 PM Jacob Burton  MRN:  [id number]   Subjective:  Chart reviewed, case discussed in multidisciplinary meeting, patient seen during rounds.  9/28: On follow-up patient is alert and oriented.  They are pleasant and cooperative on exam.  They deny adverse effects of medications.  They deny SI, HI, and AVH.  They voiced no concerns or complaints at this time.  They demonstrate fair insight into the need for outpatient follow-up and medication compliance.    9/27: On review today patient is alert and orient they are pleasant and cooperative.  They are linear logical and future oriented.  They deny SI, HI, and AVH.  He questions about discharge.  He acknowledges ongoing need of insulin .  He knowledges that he concerned given the recent insulin  overdose.  He denies any intent of the overdose.  He indicates he does have outpatient follow-up with endocrinology.  Discussed access to the insulin  pens and he indicates he has some at home.  Reports he lives with his dad.  Reports he is anxious about not seeing his son but otherwise feels that he is doing well.  Discussed plans for Monday Tuesday discharge and the need for advanced safety planning given ongoing insulin  needs and this incident that led to the admission.  He acknowledges.  He is able to discuss support system, coping skills, and crisis resources.  He has not required any behavioral PRNs.  9/26: On interview today patient is noted to be seated seated in his room.  He is calm and cooperative.  He denies current symptoms of anxiety or depression.  He denies SI/HI/plan and denies hallucinations.  He is future oriented stating he is looking forward to seeing his son upon discharge.  Diabetes educator recommended changes to insulin  regimen which have been ordered.  Diabetes educator also recommended insulin  pens as safer option considering patient's recent overdose on insulin .  She states patient will require insulin   to keep DM controlled. She further advises that insulin  pump also carries risk as he could overdose on an insulin  pump as well. TOC is involved in case to assist with getting the Basaglar  and Humalog pens, and [facility] needles from [facility]. Hospital financial counselors also involved in case. Medicaid referral has been placed.  Patient is established with an endocrinologist at [facility] clinic.   9/25: On interview today patient is noted to be sitting in his room.  He is calm and cooperative.  He denies current symptoms of depression or anxiety.  He denies SI/HI/plan and denies hallucinations.  He is sleeping well and states appetite is normal.  He is tolerating current medication regimen well without adverse effects.  He has been able to speak with his father and his girlfriend on the phone during his stay.  Per nursing report, patient has been compliant with medications and participating in group therapies.  Patient states he previously used a insulin  pump to manage type 1 diabetes but states this is currently cost prohibitive.  Patient is working with provider, diabetes coordinator, and social work team for safe discharge planning.  Team is discussing ways to help minimize risk of future insulin  overdose.  9/24: On interview today, patient is noted to be seated in his room.  He is pleasant and cooperative.  He denies current symptoms of anxiety and depression rating both as 0 out of 10 today.  He denies SI/HI/plan and denies hallucinations.  When asked again whether he thought insulin  overdose was  accidental or intentional patient replies he does not believe it was intentional but states after his blood sugar initially dropped he does not recall his frame of mind.  Patient has been attending group therapy and interacting well with peers.  He states he finds group therapies to be beneficial.  He reports tolerating current medication Prozac  10 mg once daily and denies adverse effects.  He  is accepting help from social work team regarding outpatient follow-up.  Patient gave consent to contact his fiance and father regarding care plan and safe discharge planning. Consult placed to diabetes educator to advise whether there are any safer options to manage patient's type I diabetes upon discharge given concern for intentional insulin  overdose.   9/23: On interview today patient is noted to be seated on his bed.  He is calm and cooperative.  Patient denies current depressive symptoms or anxiety.  He is sleeping well and appetite is normal.  He denies SI/HI/plan and denies hallucinations.  He has been compliant with current medication regimen of Prozac  10 mg once daily and denies adverse effects.  Patient states he is looking to set up outpatient follow-up appointments on his own but will also accept help from social work as needed. He does not voice any concerns or complaints today.   9/22: On interview today patient is noted to be seated on his bed.  He is pleasant and cooperative.  Patient initially presented with worsening depressive symptoms including lack of motivation, anhedonia and intentional overdose with insulin .  He denies depressive symptoms today and reports that overdose of insulin  was accidental.  He has been compliant with psychotropic medication, fluoxetine  10 mg once daily and is tolerating this well.  He rates depression as 0 out of 10 and anxiety as 0 out of 10 today.  He denies SI/HI/plan and denies hallucinations.  He endorses history of suicidal ideation a long time ago.  He does not voice any concerns or complaints today.  Sleep: Good  Appetite:  Good  Past Psychiatric History: see h&P Family History: History reviewed. No pertinent family history. Social History:  Social History   Substance and Sexual Activity  Alcohol Use Yes   Alcohol/week: 1.0 standard drink of alcohol   Types: 1 Cans of beer per week     Social History   Substance and Sexual Activity   Drug Use No    Social History   Socioeconomic History   Marital status: Significant Other    Spouse name: Not on file   Number of children: Not on file   Years of education: Not on file   Highest education level: Not on file  Occupational History   Not on file  Tobacco Use   Smoking status: Never   Smokeless tobacco: Never  Vaping Use   Vaping status: Never Used  Substance and Sexual Activity   Alcohol use: Yes    Alcohol/week: 1.0 standard drink of alcohol    Types: 1 Cans of beer per week   Drug use: No   Sexual activity: Yes  Other Topics Concern   Not on file  Social History Narrative   Not on file   Social Drivers of Health   Financial Resource Strain: Low Risk  (11/01/2023)   Received from [facility]   Overall Financial Resource Strain (CARDIA)    Difficulty of Paying Living Expenses: Not very hard  Food Insecurity: No Food Insecurity (11/06/2023)   Hunger Vital Sign    Worried About Running Out  of Food in the Last Year: Never true    Ran Out of Food in the Last Year: Never true  Transportation Needs: No Transportation Needs (11/06/2023)   PRAPARE - Administrator, Civil Service (Medical): No    Lack of Transportation (Non-Medical): No  Physical Activity: Not on file  Stress: Stress Concern Present (03/24/2021)   Received from [facility] - Occupational Stress Questionnaire    Feeling of Stress : To some extent  Social Connections: Not on file   Past Medical History:  Past Medical History:  Diagnosis Date   Diabetes mellitus without complication (HCC)    Insulin  Pump   History reviewed. No pertinent surgical history.  Current Medications: Current Facility-Administered Medications  Medication Dose Route Frequency Provider Last Rate Last Admin   acetaminophen  (TYLENOL ) tablet 650 mg  650 mg Oral Q6H PRN Smith, Annie B, NP       alum & mag hydroxide-simeth  (MAALOX/MYLANTA) 200-200-20 MG/5ML suspension 30 mL  30 mL Oral Q4H PRN Smith, Annie B, NP       haloperidol  (HALDOL ) tablet 5 mg  5 mg Oral TID PRN Smith, Annie B, NP       And   diphenhydrAMINE  (BENADRYL ) capsule 50 mg  50 mg Oral TID PRN Smith, Annie B, NP       haloperidol  lactate (HALDOL ) injection 5 mg  5 mg Intramuscular TID PRN Smith, Annie B, NP       And   diphenhydrAMINE  (BENADRYL ) injection 50 mg  50 mg Intramuscular TID PRN Smith, Annie B, NP       And   LORazepam  (ATIVAN ) injection 2 mg  2 mg Intramuscular TID PRN Smith, Annie B, NP       haloperidol  lactate (HALDOL ) injection 10 mg  10 mg Intramuscular TID PRN Smith, Annie B, NP       And   diphenhydrAMINE  (BENADRYL ) injection 50 mg  50 mg Intramuscular TID PRN Smith, Annie B, NP       And   LORazepam  (ATIVAN ) injection 2 mg  2 mg Intramuscular TID PRN Smith, Annie B, NP       FLUoxetine  (PROZAC ) capsule 10 mg  10 mg Oral Daily Smith, Annie B, NP   10 mg at 11/13/23 9240   hydrOXYzine  (ATARAX ) tablet 10 mg  10 mg Oral TID PRN Smith, Annie B, NP       insulin  aspart (novoLOG ) injection 0-15 Units  0-15 Units Subcutaneous TID WC Smith, Annie B, NP   3 Units at 11/13/23 9240   insulin  aspart (novoLOG ) injection 0-5 Units  0-5 Units Subcutaneous QHS Smith, Annie B, NP       insulin  aspart (novoLOG ) injection 5 Units  5 Units Subcutaneous TID WC Jadapalle, Sree, MD   5 Units at 11/13/23 1246   insulin  glargine (LANTUS ) injection 23 Units  23 Units Subcutaneous Q24H Jadapalle, Sree, MD   23 Units at 11/12/23 1443   magnesium  hydroxide (MILK OF MAGNESIA) suspension 30 mL  30 mL Oral Daily PRN Smith, Annie B, NP       traZODone  (DESYREL ) tablet 50 mg  50 mg Oral QHS PRN Smith, Annie B, NP        Lab Results:  Results for orders placed or performed during the hospital encounter of 11/06/23 (from the past 48 hours)  Glucose, capillary     Status: Abnormal   Collection Time: 11/11/23  4:21 PM  Result Value Ref Range    Glucose-Capillary 142 (H) 70 - 99 mg/dL    Comment: Glucose reference range applies only to samples taken after fasting for at least 8 hours.  Glucose, capillary     Status: Abnormal   Collection Time: 11/12/23  7:46 AM  Result Value Ref Range   Glucose-Capillary 289 (H) 70 - 99 mg/dL    Comment: Glucose reference range applies only to samples taken after fasting for at least 8 hours.  Glucose, capillary     Status: Abnormal   Collection Time: 11/12/23 11:49 AM  Result Value Ref Range   Glucose-Capillary 113 (H) 70 - 99 mg/dL    Comment: Glucose reference range applies only to samples taken after fasting for at least 8 hours.   Comment 1 Notify RN   Glucose, capillary     Status: Abnormal   Collection Time: 11/12/23  4:23 PM  Result Value Ref Range   Glucose-Capillary 149 (H) 70 - 99 mg/dL    Comment: Glucose reference range applies only to samples taken after fasting for at least 8 hours.  Glucose, capillary     Status: Abnormal   Collection Time: 11/13/23  7:33 AM  Result Value Ref Range   Glucose-Capillary 168 (H) 70 - 99 mg/dL    Comment: Glucose reference range applies only to samples taken after fasting for at least 8 hours.  Glucose, capillary     Status: None   Collection Time: 11/13/23 11:19 AM  Result Value Ref Range   Glucose-Capillary 97 70 - 99 mg/dL    Comment: Glucose reference range applies only to samples taken after fasting for at least 8 hours.    Blood Alcohol level:  Lab Results  Component Value Date   [facility] <15 11/04/2023   ETH <10 11/21/2017    Metabolic Disorder Labs: Lab Results  Component Value Date   HGBA1C 10.3 (H) 11/04/2023   MPG 248.91 11/04/2023   No results found for: PROLACTIN No results found for: CHOL, TRIG, HDL, CHOLHDL, VLDL, LDLCALC  Physical Findings: AIMS:  , ,  ,  ,    CIWA:    COWS:      Psychiatric Specialty Exam:  Presentation  General Appearance:  Appropriate for Environment  Eye  Contact: Fair  Speech: Clear and Coherent  Speech Volume: Normal    Mood and Affect  Mood: Euthymic  Affect: Appropriate   Thought Process  Thought Processes: Coherent  Descriptions of Associations: Intact Orientation:Full (Time, Place and Person)  Thought Content:WDL  Hallucinations: none  Ideas of Reference:None  Suicidal Thoughts:No   Homicidal Thoughts:No   Sensorium  Memory: Immediate Good; Recent Good; Remote Good  Judgment: Fair  Insight: Fair   Chartered certified accountant: Fair  Attention Span: Fair  Recall: Good  Fund of Knowledge: Good  Language: Good   Psychomotor Activity  Psychomotor Activity: Normal  Musculoskeletal: Strength & Muscle Tone: within normal limits Gait & Station: normal Assets  Assets: Social Support; Manufacturing systems engineer; Desire for Improvement    Physical Exam: Physical Exam Vitals and nursing note reviewed.  HENT:     Head: Atraumatic.  Eyes:     Extraocular Movements: Extraocular movements intact.  Pulmonary:     Effort: Pulmonary effort is normal.  Neurological:     Mental Status: He is alert and oriented to person, place, and time.    Review of Systems  Psychiatric/Behavioral:  Negative for depression, hallucinations, substance abuse and suicidal ideas. The patient is nervous/anxious. The  patient does not have insomnia.    Blood pressure 120/79, pulse 95, temperature (!) 97.2 F (36.2 C), resp. rate 18, height 5' 9 (1.753 m), weight 65.8 kg, SpO2 99%. Body mass index is 21.41 kg/m.  Diagnosis: Principal Problem:   Severe recurrent major depression without psychotic features (HCC)   PLAN: Safety and Monitoring:  -- Voluntary admission to inpatient psychiatric unit for safety, stabilization and treatment  -- Daily contact with patient to assess and evaluate symptoms and progress in treatment  -- Patient's case to be discussed in multi-disciplinary team meeting  --  Observation Level : q15 minute checks  -- Vital signs:  q12 hours  -- Precautions: suicide, elopement, and assault -- Encouraged patient to participate in unit milieu and in scheduled group therapies   2. Psychiatric Diagnoses and Treatment:  MDD, recurrent, severe Continue Prozac  10 mg once daily    -- The risks/benefits/side-effects/alternatives to this medication were discussed in detail with the patient and time was given for questions. The patient consents to medication trial.                -- Metabolic profile and EKG monitoring obtained while on an atypical antipsychotic (BMI: Lipid Panel: HbgA1c: QTc:)              -- Encouraged patient to participate in unit milieu and in scheduled group therapies    3. Medical Issues Being Addressed:  DM type I Diabetes coordinator helping to manage and advise on safe discharge planning  4. Discharge Planning:  Doing well we continue to work to set up safe situation for managing insulin  at discharge.             -- Monday or Tuesday  -- Social work and case management to assist with discharge planning and identification of hospital follow-up needs prior to discharge  -- Estimated LOS: 3-4 days  Donnice FORBES Right, PA-C 11/13/2023, 1:36 PM

## 2023-11-13 NOTE — Plan of Care (Signed)
  Problem: Education: Goal: Utilization of techniques to improve thought processes will improve Outcome: Progressing   Problem: Education: Goal: Knowledge of the prescribed therapeutic regimen will improve Outcome: Progressing

## 2023-11-13 NOTE — Plan of Care (Signed)
  Problem: Education: Goal: Utilization of techniques to improve thought processes will improve Outcome: Progressing Goal: Knowledge of the prescribed therapeutic regimen will improve Outcome: Progressing   Problem: Activity: Goal: Interest or engagement in leisure activities will improve Outcome: Progressing Goal: Imbalance in normal sleep/wake cycle will improve Outcome: Progressing   Problem: Coping: Goal: Coping ability will improve Outcome: Progressing Goal: Will verbalize feelings Outcome: Progressing   Problem: Health Behavior/Discharge Planning: Goal: Compliance with therapeutic regimen will improve Outcome: Progressing

## 2023-11-13 NOTE — Group Note (Signed)
 Date:  11/13/2023 Time:  9:03 PM  Group Topic/Focus:  Wrap-Up Group:   The focus of this group is to help patients review their daily goal of treatment and discuss progress on daily workbooks.    Participation Level:  Active  Participation Quality:  Appropriate  Affect:  Appropriate  Cognitive:  Appropriate  Insight: Appropriate  Engagement in Group:  Engaged  Modes of Intervention:  Discussion, Education, Socialization, and Support  Additional Comments:    Jacob Burton 11/13/2023, 9:03 PM

## 2023-11-13 NOTE — Group Note (Signed)
 Date:  11/13/2023 Time:  6:24 PM  Group Topic/Focus:  Healthy Communication:   The focus of this group is to discuss communication, barriers to communication, as well as healthy ways to communicate with others.    Participation Level:  Active  Participation Quality:  Appropriate  Affect:  Appropriate  Cognitive:  Appropriate  Insight: Appropriate  Engagement in Group:  Engaged  Modes of Intervention:  Activity  Additional Comments:    Jacob Burton Jacob Burton 11/13/2023, 6:24 PM

## 2023-11-13 NOTE — Progress Notes (Signed)
   11/13/23 0818  Psych Admission Type (Psych Patients Only)  Admission Status Involuntary  Psychosocial Assessment  Patient Complaints None  Eye Contact Darting  Facial Expression Anxious  Affect Appropriate to circumstance  Speech Logical/coherent  Interaction Assertive  Motor Activity Slow  Appearance/Hygiene Unremarkable  Behavior Characteristics Cooperative;Appropriate to situation  Mood Pleasant  Aggressive Behavior  Effect No apparent injury  Thought Process  Coherency WDL  Content WDL  Delusions None reported or observed  Perception WDL  Hallucination None reported or observed  Judgment WDL  Confusion WDL  Danger to Self  Current suicidal ideation? Denies  Self-Injurious Behavior No self-injurious ideation or behavior indicators observed or expressed   Agreement Not to Harm Self Yes  Description of Agreement verbal  Danger to Others  Danger to Others None reported or observed

## 2023-11-13 NOTE — Progress Notes (Signed)
   11/12/23 2000  Psych Admission Type (Psych Patients Only)  Admission Status Involuntary  Psychosocial Assessment  Patient Complaints None  Eye Contact Brief  Facial Expression Anxious  Affect Apprehensive  Speech Logical/coherent  Interaction Assertive  Motor Activity Slow  Appearance/Hygiene Unremarkable  Behavior Characteristics Cooperative  Mood Pleasant  Aggressive Behavior  Effect No apparent injury  Thought Process  Coherency WDL  Content WDL  Delusions None reported or observed  Perception WDL  Hallucination None reported or observed  Judgment WDL  Confusion WDL  Danger to Self  Current suicidal ideation? Denies  Danger to Others  Danger to Others None reported or observed

## 2023-11-14 LAB — GLUCOSE, CAPILLARY
Glucose-Capillary: 193 mg/dL — ABNORMAL HIGH (ref 70–99)
Glucose-Capillary: 210 mg/dL — ABNORMAL HIGH (ref 70–99)
Glucose-Capillary: 182 mg/dL — ABNORMAL HIGH (ref 70–99)
Glucose-Capillary: 122 mg/dL — ABNORMAL HIGH (ref 70–99)

## 2023-11-14 NOTE — Progress Notes (Signed)
 [facility] MD Progress Note  11/14/2023 4:50 PM Jacob Burton  MRN:  [id number]   Subjective:  Chart reviewed, case discussed in multidisciplinary meeting, patient seen during rounds.  9/29: On interview today patient is noted to be seated in his room.  He is calm and cooperative.  He continues to tolerate current medication regimen well without adverse effects.  He denies SI/HI/plan and denies hallucinations.  He cites young son as a a protective factor.  He is able to discuss social support, coping mechanisms, and crisis resources.  He continues to demonstrate fair insight and is willing to engage in outpatient services. Case discussed with pharmacist, Fonda Satchel, at [facility] at [facility] where patient was seen for hospital discharge follow-up once on 11/01/2023.  Patient has not met with pharmacist yet but has an upcoming appointment scheduled with him on 11/21/2023.  Pharmacist advised that when comparing insulin  pump versus insulin  pens, one is not necessarily safer than the other regarding risk for intentional overdose, as both can be manipulated by the patient.  He advised insulin  pump may be safer in helping to prevent unintentional overdose.  Patient previously did well on insulin  pump but discontinued it after it became cost prohibitive due to patient losing Medicaid coverage.  Pharmacist advised that insulin  pens would be a more affordable option compared to insulin  pump.  Per social work team, Medicaid application/review has been completed.  Pharmacist stated he will discuss care plan with primary care provider that patient saw previously. Safe discharge planning discussed with patient's father.  Father does not have any safety concerns and confirms patient will be returning to live with him.  Father has been in contact with patient regularly throughout his hospitalization and reports that patient seems hopeful.  Patient denies access to guns or other lethal weapons.  Father confirms  this, stating that firearms in the home are locked away in a safe and stored separately from ammunition.  Patient states he has not yet established care with an adult endocrinologist but will be following up with pharmacist at [facility] for diabetes medication management.  9/28: On follow-up patient is alert and oriented.  They are pleasant and cooperative on exam.  They deny adverse effects of medications.  They deny SI, HI, and AVH.  They voiced no concerns or complaints at this time.  They demonstrate fair insight into the need for outpatient follow-up and medication compliance.    9/27: On review today patient is alert and orient they are pleasant and cooperative.  They are linear logical and future oriented.  They deny SI, HI, and AVH.  He questions about discharge.  He acknowledges ongoing need of insulin .  He knowledges that he concerned given the recent insulin  overdose.  He denies any intent of the overdose.  He indicates he does have outpatient follow-up with endocrinology.  Discussed access to the insulin  pens and he indicates he has some at home.  Reports he lives with his dad.  Reports he is anxious about not seeing his son but otherwise feels that he is doing well.  Discussed plans for Monday Tuesday discharge and the need for advanced safety planning given ongoing insulin  needs and this incident that led to the admission.  He acknowledges.  He is able to discuss support system, coping skills, and crisis resources.  He has not required any behavioral PRNs.  9/26: On interview today patient is noted to be seated seated in his room.  He is calm and  cooperative.  He denies current symptoms of anxiety or depression.  He denies SI/HI/plan and denies hallucinations.  He is future oriented stating he is looking forward to seeing his son upon discharge.  Diabetes educator recommended changes to insulin  regimen which have been ordered.  Diabetes educator also recommended insulin   pens as safer option considering patient's recent overdose on insulin .  She states patient will require insulin  to keep DM controlled. She further advises that insulin  pump also carries risk as he could overdose on an insulin  pump as well. TOC is involved in case to assist with getting the Basaglar  and Humalog pens, and [facility] needles from [facility]. Hospital financial counselors also involved in case. Medicaid referral has been placed.  Patient is established with an endocrinologist at [facility] clinic.   9/25: On interview today patient is noted to be sitting in his room.  He is calm and cooperative.  He denies current symptoms of depression or anxiety.  He denies SI/HI/plan and denies hallucinations.  He is sleeping well and states appetite is normal.  He is tolerating current medication regimen well without adverse effects.  He has been able to speak with his father and his girlfriend on the phone during his stay.  Per nursing report, patient has been compliant with medications and participating in group therapies.  Patient states he previously used a insulin  pump to manage type 1 diabetes but states this is currently cost prohibitive.  Patient is working with provider, diabetes coordinator, and social work team for safe discharge planning.  Team is discussing ways to help minimize risk of future insulin  overdose.  9/24: On interview today, patient is noted to be seated in his room.  He is pleasant and cooperative.  He denies current symptoms of anxiety and depression rating both as 0 out of 10 today.  He denies SI/HI/plan and denies hallucinations.  When asked again whether he thought insulin  overdose was accidental or intentional patient replies he does not believe it was intentional but states after his blood sugar initially dropped he does not recall his frame of mind.  Patient has been attending group therapy and interacting well with peers.  He states he finds group therapies to  be beneficial.  He reports tolerating current medication Prozac  10 mg once daily and denies adverse effects.  He is accepting help from social work team regarding outpatient follow-up.  Patient gave consent to contact his fiance and father regarding care plan and safe discharge planning. Consult placed to diabetes educator to advise whether there are any safer options to manage patient's type I diabetes upon discharge given concern for intentional insulin  overdose.   9/23: On interview today patient is noted to be seated on his bed.  He is calm and cooperative.  Patient denies current depressive symptoms or anxiety.  He is sleeping well and appetite is normal.  He denies SI/HI/plan and denies hallucinations.  He has been compliant with current medication regimen of Prozac  10 mg once daily and denies adverse effects.  Patient states he is looking to set up outpatient follow-up appointments on his own but will also accept help from social work as needed. He does not voice any concerns or complaints today.   9/22: On interview today patient is noted to be seated on his bed.  He is pleasant and cooperative.  Patient initially presented with worsening depressive symptoms including lack of motivation, anhedonia and intentional overdose with insulin .  He denies depressive symptoms today and reports that  overdose of insulin  was accidental.  He has been compliant with psychotropic medication, fluoxetine  10 mg once daily and is tolerating this well.  He rates depression as 0 out of 10 and anxiety as 0 out of 10 today.  He denies SI/HI/plan and denies hallucinations.  He endorses history of suicidal ideation a long time ago.  He does not voice any concerns or complaints today.  Sleep: Good  Appetite:  Good  Past Psychiatric History: see h&P Family History: History reviewed. No pertinent family history. Social History:  Social History   Substance and Sexual Activity  Alcohol Use Yes   Alcohol/week: 1.0  standard drink of alcohol   Types: 1 Cans of beer per week     Social History   Substance and Sexual Activity  Drug Use No    Social History   Socioeconomic History   Marital status: Significant Other    Spouse name: Not on file   Number of children: Not on file   Years of education: Not on file   Highest education level: Not on file  Occupational History   Not on file  Tobacco Use   Smoking status: Never   Smokeless tobacco: Never  Vaping Use   Vaping status: Never Used  Substance and Sexual Activity   Alcohol use: Yes    Alcohol/week: 1.0 standard drink of alcohol    Types: 1 Cans of beer per week   Drug use: No   Sexual activity: Yes  Other Topics Concern   Not on file  Social History Narrative   Not on file   Social Drivers of Health   Financial Resource Strain: Low Risk  (11/01/2023)   Received from [facility]   Overall Financial Resource Strain (CARDIA)    Difficulty of Paying Living Expenses: Not very hard  Food Insecurity: No Food Insecurity (11/06/2023)   Hunger Vital Sign    Worried About Running Out of Food in the Last Year: Never true    Ran Out of Food in the Last Year: Never true  Transportation Needs: No Transportation Needs (11/06/2023)   PRAPARE - Administrator, Civil Service (Medical): No    Lack of Transportation (Non-Medical): No  Physical Activity: Not on file  Stress: Stress Concern Present (03/24/2021)   Received from [facility] - Occupational Stress Questionnaire    Feeling of Stress : To some extent  Social Connections: Not on file   Past Medical History:  Past Medical History:  Diagnosis Date   Diabetes mellitus without complication (HCC)    Insulin  Pump   History reviewed. No pertinent surgical history.  Current Medications: Current Facility-Administered Medications  Medication Dose Route Frequency Provider Last Rate Last Admin    acetaminophen  (TYLENOL ) tablet 650 mg  650 mg Oral Q6H PRN Smith, Annie B, NP       alum & mag hydroxide-simeth (MAALOX/MYLANTA) 200-200-20 MG/5ML suspension 30 mL  30 mL Oral Q4H PRN Smith, Annie B, NP       haloperidol  (HALDOL ) tablet 5 mg  5 mg Oral TID PRN Smith, Annie B, NP       And   diphenhydrAMINE  (BENADRYL ) capsule 50 mg  50 mg Oral TID PRN Smith, Annie B, NP       haloperidol  lactate (HALDOL ) injection 5 mg  5 mg Intramuscular TID PRN Smith, Annie B, NP       And   diphenhydrAMINE  (BENADRYL ) injection 50  mg  50 mg Intramuscular TID PRN Smith, Annie B, NP       And   LORazepam  (ATIVAN ) injection 2 mg  2 mg Intramuscular TID PRN Smith, Annie B, NP       haloperidol  lactate (HALDOL ) injection 10 mg  10 mg Intramuscular TID PRN Smith, Annie B, NP       And   diphenhydrAMINE  (BENADRYL ) injection 50 mg  50 mg Intramuscular TID PRN Smith, Annie B, NP       And   LORazepam  (ATIVAN ) injection 2 mg  2 mg Intramuscular TID PRN Smith, Annie B, NP       FLUoxetine  (PROZAC ) capsule 10 mg  10 mg Oral Daily Smith, Annie B, NP   10 mg at 11/14/23 0745   hydrOXYzine  (ATARAX ) tablet 10 mg  10 mg Oral TID PRN Smith, Annie B, NP       insulin  aspart (novoLOG ) injection 0-15 Units  0-15 Units Subcutaneous TID WC Smith, Annie B, NP   5 Units at 11/14/23 1157   insulin  aspart (novoLOG ) injection 0-5 Units  0-5 Units Subcutaneous QHS Smith, Annie B, NP   3 Units at 11/13/23 2103   insulin  aspart (novoLOG ) injection 5 Units  5 Units Subcutaneous TID WC Jadapalle, Sree, MD   5 Units at 11/14/23 1158   insulin  glargine (LANTUS ) injection 23 Units  23 Units Subcutaneous Q24H Jadapalle, Sree, MD   23 Units at 11/14/23 1411   magnesium  hydroxide (MILK OF MAGNESIA) suspension 30 mL  30 mL Oral Daily PRN Smith, Annie B, NP       traZODone  (DESYREL ) tablet 50 mg  50 mg Oral QHS PRN Smith, Annie B, NP        Lab Results:  Results for orders placed or performed during the hospital encounter of 11/06/23 (from the  past 48 hours)  Glucose, capillary     Status: Abnormal   Collection Time: 11/13/23  7:33 AM  Result Value Ref Range   Glucose-Capillary 168 (H) 70 - 99 mg/dL    Comment: Glucose reference range applies only to samples taken after fasting for at least 8 hours.  Glucose, capillary     Status: None   Collection Time: 11/13/23 11:19 AM  Result Value Ref Range   Glucose-Capillary 97 70 - 99 mg/dL    Comment: Glucose reference range applies only to samples taken after fasting for at least 8 hours.  Glucose, capillary     Status: Abnormal   Collection Time: 11/13/23  4:12 PM  Result Value Ref Range   Glucose-Capillary 147 (H) 70 - 99 mg/dL    Comment: Glucose reference range applies only to samples taken after fasting for at least 8 hours.  Glucose, capillary     Status: Abnormal   Collection Time: 11/13/23  8:02 PM  Result Value Ref Range   Glucose-Capillary 264 (H) 70 - 99 mg/dL    Comment: Glucose reference range applies only to samples taken after fasting for at least 8 hours.  Glucose, capillary     Status: Abnormal   Collection Time: 11/14/23  7:07 AM  Result Value Ref Range   Glucose-Capillary 193 (H) 70 - 99 mg/dL    Comment: Glucose reference range applies only to samples taken after fasting for at least 8 hours.   Comment 1 Notify RN   Glucose, capillary     Status: Abnormal   Collection Time: 11/14/23 11:34 AM  Result Value Ref Range   Glucose-Capillary 210 (H) 70 -  99 mg/dL    Comment: Glucose reference range applies only to samples taken after fasting for at least 8 hours.  Glucose, capillary     Status: Abnormal   Collection Time: 11/14/23  4:37 PM  Result Value Ref Range   Glucose-Capillary 182 (H) 70 - 99 mg/dL    Comment: Glucose reference range applies only to samples taken after fasting for at least 8 hours.    Blood Alcohol level:  Lab Results  Component Value Date   [facility] <15 11/04/2023   ETH <10 11/21/2017    Metabolic Disorder Labs: Lab Results   Component Value Date   HGBA1C 10.3 (H) 11/04/2023   MPG 248.91 11/04/2023   No results found for: PROLACTIN No results found for: CHOL, TRIG, HDL, CHOLHDL, VLDL, LDLCALC  Physical Findings: AIMS:  , ,  ,  ,    CIWA:    COWS:      Psychiatric Specialty Exam:  Presentation  General Appearance:  Appropriate for Environment  Eye Contact: Good  Speech: Clear and Coherent  Speech Volume: Normal    Mood and Affect  Mood: Euthymic  Affect: Appropriate   Thought Process  Thought Processes: Coherent  Descriptions of Associations: Intact Orientation:Full (Time, Place and Person)  Thought Content:WDL  Hallucinations: none  Ideas of Reference:None  Suicidal Thoughts:No   Homicidal Thoughts:No   Sensorium  Memory: Immediate Good; Recent Good; Remote Good  Judgment: Fair  Insight: Fair   Chartered certified accountant: Fair  Attention Span: Fair  Recall: Good  Fund of Knowledge: Good  Language: Good   Psychomotor Activity  Psychomotor Activity: Normal  Musculoskeletal: Strength & Muscle Tone: within normal limits Gait & Station: normal Assets  Assets: Social Support; Manufacturing systems engineer; Desire for Improvement    Physical Exam: Physical Exam Vitals and nursing note reviewed.  HENT:     Head: Atraumatic.  Eyes:     Extraocular Movements: Extraocular movements intact.  Pulmonary:     Effort: Pulmonary effort is normal.  Neurological:     Mental Status: He is alert and oriented to person, place, and time.    Review of Systems  Psychiatric/Behavioral:  Negative for depression, hallucinations, substance abuse and suicidal ideas. The patient is nervous/anxious. The patient does not have insomnia.    Blood pressure 120/78, pulse (!) 102, temperature (!) 97.3 F (36.3 C), resp. rate 18, height 5' 9 (1.753 m), weight 65.8 kg, SpO2 99%. Body mass index is 21.41 kg/m.  Diagnosis: Principal Problem:    Severe recurrent major depression without psychotic features (HCC)   PLAN: Safety and Monitoring:  -- Voluntary admission to inpatient psychiatric unit for safety, stabilization and treatment  -- Daily contact with patient to assess and evaluate symptoms and progress in treatment  -- Patient's case to be discussed in multi-disciplinary team meeting  -- Observation Level : q15 minute checks  -- Vital signs:  q12 hours  -- Precautions: suicide, elopement, and assault -- Encouraged patient to participate in unit milieu and in scheduled group therapies   2. Psychiatric Diagnoses and Treatment:  MDD, recurrent, severe Continue Prozac  10 mg once daily    -- The risks/benefits/side-effects/alternatives to this medication were discussed in detail with the patient and time was given for questions. The patient consents to medication trial.                -- Metabolic profile and EKG monitoring obtained while on an atypical antipsychotic (BMI: Lipid Panel: HbgA1c: QTc:)              --  Encouraged patient to participate in unit milieu and in scheduled group therapies    3. Medical Issues Being Addressed:  DM type I Diabetes coordinator helping to manage and advise on safe discharge planning  4. Discharge Planning:  Doing well we continue to work to set up safe situation for managing insulin  at discharge.             -- Monday or Tuesday  -- Social work and case management to assist with discharge planning and identification of hospital follow-up needs prior to discharge  -- Estimated LOS: 3-4 days  Camelia LITTIE Lukes, PA-C 11/14/2023, 4:50 PM

## 2023-11-14 NOTE — Group Note (Signed)
 Recreation Therapy Group Note   Group Topic:Coping Skills  Group Date: 11/14/2023 Start Time: 1045 End Time: 1135 Facilitators: Celestia Jeoffrey FORBES ARTICE, CTRS Location: Craft Room  Group Description: Mind Map.  Patient was provided a blank template of a diagram with 32 blank boxes in a tiered system, branching from the center (similar to a bubble chart). LRT directed patients to label the middle of the diagram Coping Skills. LRT and patients then came up with 8 different coping skills as examples. Pt were directed to record their coping skills in the 2nd tier boxes closest to the center.  Patients would then share their coping skills with the group as LRT wrote them out. LRT gave a handout of 99 different coping skills at the end of group.   Goal Area(s) Addressed: Patients will be able to define "coping skills". Patient will identify new coping skills.  Patient will increase communication.   Affect/Mood: Appropriate   Participation Level: Active and Engaged   Participation Quality: Independent   Behavior: Appropriate, Calm, and Cooperative   Speech/Thought Process: Coherent   Insight: Good   Judgement: Good   Modes of Intervention: Clarification, Education, Exploration, Worksheet, and Writing   Patient Response to Interventions:  Attentive, Engaged, Interested , and Receptive   Education Outcome:  Acknowledges education   Clinical Observations/Individualized Feedback: Abriel was active in their participation of session activities and group discussion. Pt identified writing and being with my son as coping skills.    Plan: Continue to engage patient in RT group sessions 2-3x/week.   [address] LRT, CTRS 11/14/2023 1:28 PM

## 2023-11-14 NOTE — Group Note (Signed)
 Date:  11/14/2023 Time:  8:59 PM  Group Topic/Focus:  Overcoming Stress:   The focus of this group is to define stress and help patients assess their triggers. Wrap-Up Group:   The focus of this group is to help patients review their daily goal of treatment and discuss progress on daily workbooks.    Participation Level:  Active  Participation Quality:  Appropriate and Attentive  Affect:  Appropriate  Cognitive:  Appropriate  Insight: Appropriate and Good  Engagement in Group:  Engaged  Modes of Intervention:  Activity  Additional Comments:     Jacob Burton Servant 11/14/2023, 8:59 PM

## 2023-11-14 NOTE — Progress Notes (Signed)
   11/14/23 0753  Psych Admission Type (Psych Patients Only)  Admission Status Involuntary  Psychosocial Assessment  Patient Complaints None  Eye Contact Fair  Facial Expression Anxious  Affect Anxious  Speech Logical/coherent  Interaction Assertive  Motor Activity Slow  Appearance/Hygiene Unremarkable  Behavior Characteristics Cooperative  Mood Pleasant  Aggressive Behavior  Effect No apparent injury  Thought Process  Coherency WDL  Content WDL  Delusions None reported or observed  Perception WDL  Hallucination None reported or observed  Judgment WDL  Confusion WDL  Danger to Self  Current suicidal ideation? Denies  Self-Injurious Behavior No self-injurious ideation or behavior indicators observed or expressed   Agreement Not to Harm Self Yes  Description of Agreement verbal  Danger to Others  Danger to Others None reported or observed

## 2023-11-14 NOTE — BHH Counselor (Signed)
 CSW met with pt briefly regarding contact with family. He was agreeable to contact with his father, Jabreel Chimento and consent was completed. No other concerns expressed. Contact ended without incident.   Nadara SAUNDERS. Chaim, MSW, LCSW, LCAS 11/14/2023 4:18 PM

## 2023-11-14 NOTE — Inpatient Diabetes Management (Signed)
 Inpatient Diabetes Program Recommendations  AACE/ADA: New Consensus Statement on Inpatient Glycemic Control   Target Ranges:  Prepandial:   less than 140 mg/dL      Peak postprandial:   less than 180 mg/dL (1-2 hours)      Critically ill patients:  140 - 180 mg/dL    Latest Reference Range & Units 11/13/23 07:33 11/13/23 11:19 11/13/23 16:12 11/13/23 20:02  Glucose-Capillary 70 - 99 mg/dL 831 (H) 97 852 (H) 735 (H)   Review of Glycemic Control  Diabetes history: DM1 Outpatient Diabetes medications: Lantus  12 units BID, Novolog  1 unit per 8 grams of carbs, Novolog  for correction (1 unit drops glucose 30 mg/dl) Current orders for Inpatient glycemic control: Lantus  23 units Q24H, Novolog  0-15 units TID with meals, Novolog  0-5 units at bedtime, Novolog  5 units TID with meals   Inpatient Diabetes Program Recommendations:     Outpatient DM: At discharge patient will require insulin .  Patient has Type 1 DM and makes NO insulin  at all and oral DM medications would not work for patient.  Even with intentional insulin  overdose, patient will have to discharge on insulin  as it is the only treatment for Type 1 DM.  Recommend to discharge patient on same insulin  regimen as ordered while inpatient on day of discharge.  At discharge, if patient is able to get medications from [facility], please provide Rx for Basaglar  pens [phone]), Humalog Kwikpens [phone]), and insulin  pen needles [phone]).    NOTE: Patient admitted with intentional overdose with insulin . Patient has Type 1 DM and will require insulin . Patient has no insurance.  Inpatient diabetes coordinator spoke with patient on 11/04/23.  TOC consult for assistance with patient getting medications at discharge.    Thanks, Earnie Gainer, RN, MSN, CDCES Diabetes Coordinator Inpatient Diabetes Program [phone] (Team Pager from 8am to 5pm)

## 2023-11-14 NOTE — Plan of Care (Signed)
  Problem: Education: Goal: Utilization of techniques to improve thought processes will improve Outcome: Progressing Goal: Knowledge of the prescribed therapeutic regimen will improve Outcome: Progressing   Problem: Activity: Goal: Interest or engagement in leisure activities will improve Outcome: Progressing Goal: Imbalance in normal sleep/wake cycle will improve Outcome: Progressing   Problem: Coping: Goal: Coping ability will improve Outcome: Progressing Goal: Will verbalize feelings Outcome: Progressing   Problem: Health Behavior/Discharge Planning: Goal: Ability to make decisions will improve Outcome: Progressing Goal: Compliance with therapeutic regimen will improve Outcome: Progressing   

## 2023-11-14 NOTE — Group Note (Signed)
 [facility] LCSW Group Therapy Note    Group Date: 11/14/2023 Start Time: 1300 End Time: 1340  Type of Therapy and Topic:  Group Therapy:  Overcoming Obstacles  Participation Level:  [facility] PARTICIPATION LEVEL: Active   Description of Group:   In this group patients will be encouraged to explore what they see as obstacles to their own wellness and recovery. They will be guided to discuss their thoughts, feelings, and behaviors related to these obstacles. The group will process together ways to cope with barriers, with attention given to specific choices patients can make. Each patient will be challenged to identify changes they are motivated to make in order to overcome their obstacles. This group will be process-oriented, with patients participating in exploration of their own experiences as well as giving and receiving support and challenge from other group members.  Therapeutic Goals: 1. Patient will identify personal and current obstacles as they relate to admission. 2. Patient will identify barriers that currently interfere with their wellness or overcoming obstacles.  3. Patient will identify feelings, thought process and behaviors related to these barriers. 4. Patient will identify two changes they are willing to make to overcome these obstacles:    Summary of Patient Progress Patient was present for the entirety of the group process. He was actively engaged in the discussion. Pt shared that he struggles with making new friends and that fear gets in the way of this. He appeared open and receptive to comments/feedback from both his peers and the facilitator.   Therapeutic Modalities:   Cognitive Behavioral Therapy Solution Focused Therapy Motivational Interviewing Relapse Prevention Therapy   Nadara JONELLE Fam, LCSW

## 2023-11-14 NOTE — Progress Notes (Signed)
   11/14/23 1015  Spiritual Encounters  Type of Visit Initial  Care provided to: Patient  Reason for visit Routine spiritual support  OnCall Visit Yes    Chaplain was rounding on the Unit and met patient as he was in an informal group with other patients in the Activities Room and invited Chaplain to join in.  Chaplain welcomed the opportunity to join with the patient and engage.   Rev. Rana M. Nicholaus, M.Div. Chaplain Resident [facility]

## 2023-11-15 ENCOUNTER — Other Ambulatory Visit: Payer: Self-pay

## 2023-11-15 LAB — GLUCOSE, CAPILLARY: Glucose-Capillary: 251 mg/dL — ABNORMAL HIGH (ref 70–99)

## 2023-11-15 MED ORDER — INSULIN GLARGINE 100 UNIT/ML SOLOSTAR PEN
23.0000 [IU] | PEN_INJECTOR | SUBCUTANEOUS | 0 refills | Status: AC
  Filled 2023-11-15: qty 15, 65d supply, fill #0

## 2023-11-15 MED ORDER — FLUOXETINE HCL 10 MG PO CAPS
10.0000 mg | ORAL_CAPSULE | Freq: Every day | ORAL | 0 refills | Status: AC
  Filled 2023-11-15: qty 30, 30d supply, fill #0

## 2023-11-15 MED ORDER — INSULIN LISPRO (1 UNIT DIAL) 100 UNIT/ML (KWIKPEN)
5.0000 [IU] | PEN_INJECTOR | Freq: Three times a day (TID) | SUBCUTANEOUS | 0 refills | Status: AC
  Filled 2023-11-15: qty 15, 100d supply, fill #0

## 2023-11-15 MED ORDER — INSULIN PEN NEEDLE 32G X 4 MM MISC
1.0000 | 0 refills | Status: AC
  Filled 2023-11-15: qty 100, 25d supply, fill #0

## 2023-11-15 NOTE — B[facility] Suicide Risk Assessment (Signed)
 [facility] Discharge Suicide Risk Assessment   Principal Problem: Severe recurrent major depression without psychotic features [facility]) Discharge Diagnoses: Principal Problem:   Severe recurrent major depression without psychotic features (HCC)   Total Time spent with patient: 30 minutes  Musculoskeletal: Strength & Muscle Tone: within normal limits Gait & Station: normal Patient leans: N/A  Psychiatric Specialty Exam  Presentation  General Appearance:  Appropriate for Environment  Eye Contact: Good  Speech: Clear and Coherent  Speech Volume: Normal  Handedness:No data recorded  Mood and Affect  Mood: Euthymic  Duration of Depression Symptoms: Greater than two weeks  Affect: Appropriate   Thought Process  Thought Processes: Coherent; Linear  Descriptions of Associations:Intact  Orientation:Full (Time, Place and Person)  Thought Content:Logical  History of Schizophrenia/Schizoaffective disorder:No  Duration of Psychotic Symptoms:No data recorded Hallucinations:Hallucinations: None  Ideas of Reference:None  Suicidal Thoughts:Suicidal Thoughts: No  Homicidal Thoughts:Homicidal Thoughts: No   Sensorium  Memory: Immediate Good; Recent Good; Remote Good  Judgment: Fair  Insight: Fair   Art therapist  Concentration: Good  Attention Span: Good  Recall: Good  Fund of Knowledge: Fair  Language: Fair   Psychomotor Activity  Psychomotor Activity: Psychomotor Activity: Normal   Assets  Assets: Manufacturing systems engineer; Housing; Social Support; Desire for Improvement   Sleep  Sleep: Sleep: Good  Estimated Sleeping Duration (Last 24 Hours): 6.00-7.75 hours  Physical Exam: Physical Exam ROS Blood pressure 119/76, pulse 95, temperature 98.3 F (36.8 C), resp. rate 16, height 5' 9 (1.753 m), weight 65.8 kg, SpO2 99%. Body mass index is 21.41 kg/m.  Mental Status Per Nursing Assessment::   On Admission:  Self-harm  behaviors  Demographic Factors:  Caucasian  Loss Factors: NA  Historical Factors: Prior suicide attempt  Risk Reduction Factors:   Responsible for children under 33 years of age, Living with another person, especially a relative, and Positive social support  Continued Clinical Symptoms:  Depression:   Impulsivity  Cognitive Features That Contribute To Risk:  None    Suicide Risk:  Minimal: No identifiable suicidal ideation.  Patients presenting with no risk factors but with morbid ruminations; may be classified as minimal risk based on the severity of the depressive symptoms   Follow-up Information     [facility] Follow up.   Why: In person assessment for therapy and medication management is 11/16/2023 at 10 AM. Contact information: [address] [place] [address] [phone]                 Plan Of Care/Follow-up recommendations:  Activity:  as tolerated   Camelia LITTIE Lukes, PA-C 11/15/2023, 8:43 AM
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
## 2023-11-15 NOTE — Plan of Care (Signed)
  Problem: Education: Goal: Utilization of techniques to improve thought processes will improve Outcome: Progressing Goal: Knowledge of the prescribed therapeutic regimen will improve Outcome: Progressing   Problem: Activity: Goal: Interest or engagement in leisure activities will improve Outcome: Progressing Goal: Imbalance in normal sleep/wake cycle will improve Outcome: Progressing   

## 2023-11-15 NOTE — BHH Counselor (Signed)
 CSW touched base with Financial navigator, Jacob Burton who reported that patient's Medicaid referral has been submitted and can take up to 45 days ot process.   CSW touched base  with [facility] to assess if the patient could be enrolled in their dispensary of hope program. Patient meets criteria and it has been added to his account. Patient will have to complete application in person at the pharmacy at discharge when he goes to pickup his medications.   CSW touched base with provider to ensure that patient's insulin  was covered under the [facility] dispensary of hope's program and pharmacy tech confirmed that it does. Pharmacy tech confirmed that with this, the patient's medication would be $0- $8. Pharmacy tech reported that his can be charged to the patient's account if he is unable to afford it and when his Medicaid is approved, it would be deducted to $0.   Provider would need to have patent's medications sent to [facility] outpatient pharmacy for this to be completed. Provider expressed understanding.   This has been communicated to patient and team.   CSW to continue to assess.   Jacob Burton, MSW, LCSWA 11/15/2023 9:10 AM

## 2023-11-15 NOTE — Progress Notes (Signed)
 Patient denies SI/I/AVH at this time. Discharge instructions, AVS, prescriptions, and transition record reviewed with patient. Discharge medications received from pharmacy and sent with patient. Patient agrees to comply with medication management, follow-up visit and outpatient therapy. Patient belongings returned to patient. Patient questions and concerns addressed and answered.  Patient ambulatory off unit. Patient discharged home with girlfriend.

## 2023-11-15 NOTE — Progress Notes (Signed)
   11/15/23 1000  Psych Admission Type (Psych Patients Only)  Admission Status Voluntary  Psychosocial Assessment  Patient Complaints None  Eye Contact Fair  Facial Expression Anxious;Animated  Affect Appropriate to circumstance  Speech Logical/coherent  Interaction Assertive  Motor Activity Other (Comment) (appropriate for developmental age)  Appearance/Hygiene Unremarkable  Behavior Characteristics Cooperative;Appropriate to situation  Mood Pleasant;Euthymic  Thought Process  Coherency WDL  Content WDL  Delusions None reported or observed  Perception WDL  Hallucination None reported or observed  Judgment WDL  Confusion None  Danger to Self  Current suicidal ideation? Denies  Self-Injurious Behavior No self-injurious ideation or behavior indicators observed or expressed   Danger to Others  Danger to Others None reported or observed

## 2023-11-15 NOTE — Plan of Care (Signed)
  Problem: Education: Goal: Utilization of techniques to improve thought processes will improve Outcome: Adequate for Discharge Goal: Knowledge of the prescribed therapeutic regimen will improve Outcome: Adequate for Discharge   Problem: Activity: Goal: Interest or engagement in leisure activities will improve Outcome: Adequate for Discharge Goal: Imbalance in normal sleep/wake cycle will improve Outcome: Adequate for Discharge   Problem: Coping: Goal: Coping ability will improve Outcome: Adequate for Discharge Goal: Will verbalize feelings Outcome: Adequate for Discharge   Problem: Health Behavior/Discharge Planning: Goal: Ability to make decisions will improve Outcome: Adequate for Discharge Goal: Compliance with therapeutic regimen will improve Outcome: Adequate for Discharge   Problem: Role Relationship: Goal: Will demonstrate positive changes in social behaviors and relationships Outcome: Adequate for Discharge   Problem: Safety: Goal: Ability to disclose and discuss suicidal ideas will improve Outcome: Adequate for Discharge Goal: Ability to identify and utilize support systems that promote safety will improve Outcome: Adequate for Discharge   Problem: Self-Concept: Goal: Will verbalize positive feelings about self Outcome: Adequate for Discharge Goal: Level of anxiety will decrease Outcome: Adequate for Discharge

## 2023-11-15 NOTE — Progress Notes (Signed)
   11/15/23 0200  Psych Admission Type (Psych Patients Only)  Admission Status Involuntary  Psychosocial Assessment  Patient Complaints None  Eye Contact Fair  Facial Expression Anxious  Affect Anxious  Speech Logical/coherent  Interaction Assertive  Motor Activity Slow  Appearance/Hygiene Unremarkable  Behavior Characteristics Cooperative  Mood Pleasant  Aggressive Behavior  Effect No apparent injury  Thought Process  Coherency WDL  Content WDL  Delusions None reported or observed  Perception WDL  Hallucination None reported or observed  Judgment WDL  Confusion WDL  Danger to Self  Current suicidal ideation? Denies  Self-Injurious Behavior No self-injurious ideation or behavior indicators observed or expressed   Agreement Not to Harm Self Yes  Danger to Others  Danger to Others None reported or observed

## 2023-11-15 NOTE — Progress Notes (Signed)
  [facility] Adult Case Management Discharge Plan :  Will you be returning to the same living situation after discharge:  Yes,  Patient to return home.  At discharge, do you have transportation home?: Yes,  patient to be picked up by his partner.  Do you have the ability to pay for your medications: Yes,  Patient has Medicaid Pending and has been referred to [facility] dispensary of [facility].   Release of information consent forms completed and in the chart;  Patient's signature needed at discharge.  Patient to Follow up at:  Follow-up Information     [facility] Follow up.   Why: In person assessment for therapy and medication management is 11/16/2023 at 10 AM. Contact information: [address] [place] [address] [phone]                 Next level of care provider has access to [facility] Link:no  Safety Planning and Suicide Prevention discussed: Yes,  Education Completed; Concha Cory, girlfriend, [phone],  (name of  has been identified by the patient as the family member/significant other with whom the patient will be residing, and identified as the person(s) who will aid the patient in the event of a mental health crisis (suicidal ideations/suicide attempt).  With written consent from the patient, the family member/significant other has been provided the following suicide prevention education, prior to the and/or following the discharge of the patient.     Has patient been referred to the Quitline?: Patient does not use tobacco/nicotine products  Patient has been referred for addiction treatment: No known substance use disorder.  [name], LCSW 11/15/2023, 9:20 AM

## 2024-01-06 ENCOUNTER — Other Ambulatory Visit: Payer: Self-pay

## 2024-02-19 ENCOUNTER — Emergency Department

## 2024-02-19 ENCOUNTER — Other Ambulatory Visit: Payer: Self-pay

## 2024-02-19 ENCOUNTER — Emergency Department
Admission: EM | Admit: 2024-02-19 | Discharge: 2024-02-19 | Disposition: A | Discharge status: Home / Self Care | Attending: Emergency Medicine | Admitting: Emergency Medicine

## 2024-02-19 DIAGNOSIS — J020 Streptococcal pharyngitis: Secondary | ICD-10-CM | POA: Diagnosis not present

## 2024-02-19 DIAGNOSIS — R059 Cough, unspecified: Secondary | ICD-10-CM | POA: Insufficient documentation

## 2024-02-19 DIAGNOSIS — J029 Acute pharyngitis, unspecified: Secondary | ICD-10-CM | POA: Diagnosis present

## 2024-02-19 DIAGNOSIS — R7401 Elevation of levels of liver transaminase levels: Secondary | ICD-10-CM | POA: Diagnosis not present

## 2024-02-19 DIAGNOSIS — E109 Type 1 diabetes mellitus without complications: Secondary | ICD-10-CM | POA: Insufficient documentation

## 2024-02-19 LAB — COMPREHENSIVE METABOLIC PANEL WITH GFR
ALT: 373 U/L — ABNORMAL HIGH (ref 0–44)
AST: 292 U/L — ABNORMAL HIGH (ref 15–41)
Albumin: 4.3 g/dL (ref 3.5–5.0)
Alkaline Phosphatase: 130 U/L — ABNORMAL HIGH (ref 38–126)
Anion gap: 16 — ABNORMAL HIGH (ref 5–15)
BUN: 11 mg/dL (ref 6–20)
CO2: 21 mmol/L — ABNORMAL LOW (ref 22–32)
Calcium: 9.1 mg/dL (ref 8.9–10.3)
Chloride: 103 mmol/L (ref 98–111)
Creatinine, Ser: 0.81 mg/dL (ref 0.61–1.24)
GFR, Estimated: >60 mL/min
Glucose, Bld: 196 mg/dL — ABNORMAL HIGH (ref 70–99)
Potassium: 4.1 mmol/L (ref 3.5–5.1)
Sodium: 140 mmol/L (ref 135–145)
Total Bilirubin: 0.6 mg/dL (ref 0.0–1.2)
Total Protein: 7.2 g/dL (ref 6.5–8.1)

## 2024-02-19 LAB — URINALYSIS, ROUTINE W REFLEX MICROSCOPIC
Bacteria, UA: NONE SEEN
Bilirubin Urine: NEGATIVE
Glucose, UA: >=500 mg/dL — AB
Hgb urine dipstick: NEGATIVE
Ketones, ur: NEGATIVE mg/dL
Leukocytes,Ua: NEGATIVE
Nitrite: NEGATIVE
Protein, ur: NEGATIVE mg/dL
Specific Gravity, Urine: 1.016 (ref 1.005–1.030)
pH: 5 (ref 5.0–8.0)

## 2024-02-19 LAB — CBC
HCT: 43.3 % (ref 39.0–52.0)
Hemoglobin: 14.9 g/dL (ref 13.0–17.0)
MCH: 30.2 pg (ref 26.0–34.0)
MCHC: 34.4 g/dL (ref 30.0–36.0)
MCV: 87.7 fL (ref 80.0–100.0)
Platelets: 343 K/uL (ref 150–400)
RBC: 4.94 MIL/uL (ref 4.22–5.81)
RDW: 12.2 % (ref 11.5–15.5)
WBC: 12.4 K/uL — ABNORMAL HIGH (ref 4.0–10.5)
nRBC: 0 % (ref 0.0–0.2)

## 2024-02-19 LAB — LIPASE, BLOOD: Lipase: 12 U/L (ref 11–51)

## 2024-02-19 LAB — GROUP A STREP BY PCR: Group A Strep by PCR: DETECTED — AB

## 2024-02-19 MED ORDER — SODIUM CHLORIDE 0.9 % IV BOLUS
1000.0000 mL | Freq: Once | INTRAVENOUS | Status: AC
  Administered 2024-02-19: 1000 mL via INTRAVENOUS

## 2024-02-19 MED ORDER — ONDANSETRON HCL 4 MG/2ML IJ SOLN
4.0000 mg | Freq: Once | INTRAMUSCULAR | Status: AC
  Administered 2024-02-19: 4 mg via INTRAVENOUS
  Filled 2024-02-19: qty 2

## 2024-02-19 MED ORDER — CLINDAMYCIN HCL 150 MG PO CAPS
300.0000 mg | ORAL_CAPSULE | Freq: Once | ORAL | Status: AC
  Administered 2024-02-19: 300 mg via ORAL
  Filled 2024-02-19: qty 2

## 2024-02-19 MED ORDER — KETOROLAC TROMETHAMINE 30 MG/ML IJ SOLN
30.0000 mg | Freq: Once | INTRAMUSCULAR | Status: AC
  Administered 2024-02-19: 30 mg via INTRAVENOUS
  Filled 2024-02-19: qty 1

## 2024-02-19 MED ORDER — CLINDAMYCIN HCL 300 MG PO CAPS: 300.0000 mg | ORAL_CAPSULE | Freq: Three times a day (TID) | ORAL | 0 refills | Status: AC

## 2024-02-19 MED ORDER — ONDANSETRON 4 MG PO TBDP: 4.0000 mg | ORAL_TABLET | Freq: Three times a day (TID) | ORAL | 0 refills | Status: AC | PRN

## 2024-02-19 NOTE — Discharge Instructions (Addendum)
 Your exam, labs, chest x-ray, and ultrasound have been reviewed.  No signs of acute pneumonia or bronchitis.  No indication of gallstones.  You did have elevated liver function enzymes which should be followed by GI specialist.  Your strep test did confirm positive, but your remaining labs were within normal limits.  Continue to rest and hydrate as discussed.  Take the prescription antibiotic 3 times daily until all pills are completed.  Take the nausea medicine as needed.  Follow-up with your primary provider for ongoing evaluation.  Follow-up with GI medicine as suggested.

## 2024-02-19 NOTE — ED Provider Notes (Signed)
 "   [facility] Emergency Department Provider Note     Event Date/Time   First MD Initiated Contact with Patient 02/19/24 1230     (approximate)   History   Emesis   HPI  Jacob Burton is a 24 y.o. male with a history of DM type I, presents to the ED endorsing several days of vomiting, diarrhea, cough, and sore throat.  He also notes some lower chest wall pain that he believes is related to the cough.  Patient denies any frank fever, chills, sweats, or central/substernal chest pain.  Patient reports overall the symptoms have been worsened today.  Denies any frank fevers.  Chart review reveals the patient had an admission about 6 months ago due to DKA.  Lab review at that time revealed a transaminitis but no evidence that he was followed through for that acute incidental finding.  Physical Exam   Triage Vital Signs: ED Triage Vitals  Encounter Vitals Group     BP 02/19/24 1221 126/76     Girls Systolic BP Percentile --      Girls Diastolic BP Percentile --      Boys Systolic BP Percentile --      Boys Diastolic BP Percentile --      Pulse Rate 02/19/24 1221 (!) 120     Resp 02/19/24 1221 18     Temp 02/19/24 1221 98.8 F (37.1 C)     Temp src --      SpO2 02/19/24 1221 97 %     Weight 02/19/24 1220 140 lb (63.5 kg)     Height 02/19/24 1220 5' 9 (1.753 m)     Head Circumference --      Peak Flow --      Pain Score 02/19/24 1220 6     Pain Loc --      Pain Education --      Exclude from Growth Chart --     Most recent vital signs: Vitals:   02/19/24 1221 02/19/24 1508  BP: 126/76 120/63  Pulse: (!) 120 (!) 115  Resp: 18 17  Temp: 98.8 F (37.1 C)   SpO2: 97% 97%    General Awake, no distress. NAD HEENT NCAT. PERRL. EOMI. No rhinorrhea. Mucous membranes are moist. CV:  Good peripheral perfusion. RRR RESP:  Normal effort. CTA ABD:  No distention.  Soft and nontender.  No rebound, guarding, or rigidity noted.  No rectal quadrant  tenderness on palpation.  No organomegaly noted.  Normoactive bowel sounds x 4.  No CVA tenderness elicited.   ED Results / Procedures / Treatments   Labs (all labs ordered are listed, but only abnormal results are displayed) Labs Reviewed  GROUP A STREP BY PCR - Abnormal; Notable for the following components:      Result Value   Group A Strep by PCR DETECTED (*)    All other components within normal limits  COMPREHENSIVE METABOLIC PANEL WITH GFR - Abnormal; Notable for the following components:   CO2 21 (*)    Glucose, Bld 196 (*)    AST 292 (*)    ALT 373 (*)    Alkaline Phosphatase 130 (*)    Anion gap 16 (*)    All other components within normal limits  CBC - Abnormal; Notable for the following components:   WBC 12.4 (*)    All other components within normal limits  URINALYSIS, ROUTINE W REFLEX MICROSCOPIC - Abnormal; Notable for the following components:  Color, Urine YELLOW (*)    APPearance CLEAR (*)    Glucose, UA >=500 (*)    All other components within normal limits  LIPASE, BLOOD    EKG   RADIOLOGY  I personally viewed and evaluated these images as part of my medical decision making, as well as reviewing the written report by the radiologist.  ED Provider Interpretation: Mild hepatic steatosis; no acute cardiopulmonary findings  US  Abdomen Limited RUQ (LIVER/GB) Result Date: 02/19/2024 EXAM: US  ABDOMEN LIMITED 02/19/2024 01:39:22 PM TECHNIQUE: Real-time ultrasonography of the right upper quadrant of the abdomen was performed. COMPARISON: None available. CLINICAL HISTORY: Liver function abnormality. FINDINGS: LIVER: Mildly increased parenchymal echogenicity of the liver suggestive of hepatic steatosis. No intrahepatic biliary ductal dilatation. No evidence of mass. Hepatopetal flow in the portal vein. BILIARY SYSTEM: Gallbladder wall thickness measures 0.2 cm. Negative sonographic Murphy sign. No pericholecystic fluid or wall thickening. No cholelithiasis. The  common bile duct measures 0.3 cm. OTHER: No right upper quadrant ascites. IMPRESSION: 1. Mildly increased hepatic parenchymal echogenicity, suggestive of hepatic steatosis. 2. No acute findings. Electronically signed by: Waddell Calk MD 02/19/2024 01:48 PM EST RP Workstation: [id number]   DG Chest 2 View Result Date: 02/19/2024 EXAM: 2 VIEW(S) XRAY OF THE CHEST 02/19/2024 12:56:46 PM COMPARISON: 01/21/2021 CLINICAL HISTORY: Cough and sore throat. Complains of vomiting and diarrhea. FINDINGS: LUNGS AND PLEURA: No focal pulmonary opacity. No pleural effusion. No pneumothorax. HEART AND MEDIASTINUM: No acute abnormality of the cardiac and mediastinal silhouettes. BONES AND SOFT TISSUES: No acute osseous abnormality. IMPRESSION: 1. No acute cardiopulmonary abnormality. Electronically signed by: Waddell Calk MD 02/19/2024 01:01 PM EST RP Workstation: [id number]     PROCEDURES:  Critical Care performed: No  Procedures   MEDICATIONS ORDERED IN ED: Medications  sodium chloride  0.9 % bolus 1,000 mL (0 mLs Intravenous Stopped 02/19/24 1445)  ondansetron  (ZOFRAN ) injection 4 mg (4 mg Intravenous Given 02/19/24 1333)  ketorolac  (TORADOL ) 30 MG/ML injection 30 mg (30 mg Intravenous Given 02/19/24 1442)  clindamycin  (CLEOCIN ) capsule 300 mg (300 mg Oral Given 02/19/24 1444)     IMPRESSION / MDM / ASSESSMENT AND PLAN / ED COURSE  I reviewed the triage vital signs and the nursing notes.                              Differential diagnosis includes, but is not limited to, COVID, flu, RSV, viral URI, strep pharyngitis, CAP, bronchitis, hepatitis, electrolyte abnormalities, DKA  Patient's presentation is most consistent with acute complicated illness / injury requiring diagnostic workup.  Patient's diagnosis is consistent with strep pharyngitis and transaminitis noted on exam.  Patient to the ED for evaluation of 3 to 4 days of nausea, vomiting, diarrhea with associated sore throat.  Patient was found to have  strep throat on his PCR.  Chest x-ray turbid by me, shows no acute intrathoracic process.  Labs otherwise reassuring with a mild elevated WBC at 12.4, patient's glucose was 196, and a mild anion gap at 16.  Transaminitis was again noted with AST/ALT at 292/373, and glucose urea noted on UA.  Right upper quadrant ultrasound, interpreted by me, shows no evidence of any gallbladder disease or hepatomegaly.  Mild steatosis is appreciated.  Patient treated in the ED with IV fluid bolus, IV pain and nausea medicines as well as an oral dose of Cleocin  given his confirm penicillin and macrolide allergies.  Patient will be discharged home with prescriptions for clindamycin  and  Zofran .  Patient will continue to monitor and treat his symptoms with OTC meds, and monitor his blood sugars.  Patient is to follow up with his PCP, he has been referred to GI for ongoing evaluation of his transaminitis, as needed or otherwise directed. Patient is given ED precautions to return to the ED for any worsening or new symptoms.     FINAL CLINICAL IMPRESSION(S) / ED DIAGNOSES   Final diagnoses:  Strep throat  Transaminitis     Rx / DC Orders   ED Discharge Orders          Ordered    clindamycin  (CLEOCIN ) 300 MG capsule  3 times daily        02/19/24 1437    ondansetron  (ZOFRAN -ODT) 4 MG disintegrating tablet  Every 8 hours PRN        02/19/24 1437             Note:  This document was prepared using Dragon voice recognition software and may include unintentional dictation errors.    Loyd Candida LULLA Aldona, PA-C 02/19/24 1819    Jacolyn Pae, MD 02/19/24 1928  "

## 2024-02-19 NOTE — ED Triage Notes (Signed)
 Pt comes via POV with c/o vomiting and diarrhea. Pt also states cough and sore throat.. Pt states this started 4 days ago. Pt states worse today.
